# Patient Record
Sex: Female | Born: 1950
Health system: Southern US, Community
[De-identification: ages and names within clinical notes are randomized; demographics above are authoritative.]

## PROBLEM LIST (undated history)

## (undated) DIAGNOSIS — E785 Hyperlipidemia, unspecified: Secondary | ICD-10-CM

## (undated) DIAGNOSIS — R42 Dizziness and giddiness: Secondary | ICD-10-CM

## (undated) DIAGNOSIS — F32A Depression, unspecified: Secondary | ICD-10-CM

## (undated) DIAGNOSIS — Z8701 Personal history of pneumonia (recurrent): Secondary | ICD-10-CM

## (undated) DIAGNOSIS — Z8619 Personal history of other infectious and parasitic diseases: Secondary | ICD-10-CM

## (undated) DIAGNOSIS — G629 Polyneuropathy, unspecified: Secondary | ICD-10-CM

## (undated) DIAGNOSIS — K219 Gastro-esophageal reflux disease without esophagitis: Secondary | ICD-10-CM

## (undated) DIAGNOSIS — E119 Type 2 diabetes mellitus without complications: Secondary | ICD-10-CM

## (undated) DIAGNOSIS — G43909 Migraine, unspecified, not intractable, without status migrainosus: Secondary | ICD-10-CM

## (undated) HISTORY — PX: BREAST BIOPSY: SHX20

## (undated) HISTORY — PX: BILATERAL OOPHORECTOMY: SHX1221

## (undated) HISTORY — DX: Personal history of other infectious and parasitic diseases: Z86.19

## (undated) HISTORY — PX: OTHER SURGICAL HISTORY: SHX169

## (undated) HISTORY — DX: Dizziness and giddiness: R42

## (undated) HISTORY — PX: IUD REMOVAL: SHX5392

## (undated) HISTORY — DX: Type 2 diabetes mellitus without complications: E11.9

## (undated) HISTORY — DX: Personal history of pneumonia (recurrent): Z87.01

## (undated) HISTORY — PX: SHOULDER SURGERY: SHX246

## (undated) HISTORY — DX: Migraine, unspecified, not intractable, without status migrainosus: G43.909

## (undated) HISTORY — DX: Hyperlipidemia, unspecified: E78.5

## (undated) HISTORY — DX: Depression, unspecified: F32.A

## (undated) HISTORY — DX: Gastro-esophageal reflux disease without esophagitis: K21.9

## (undated) HISTORY — PX: ABDOMINAL HYSTERECTOMY: SHX81

## (undated) HISTORY — PX: CHOLECYSTECTOMY: SHX55

---

## 1997-10-30 ENCOUNTER — Other Ambulatory Visit: Admission: RE | Admit: 1997-10-30 | Discharge: 1997-10-30 | Payer: Self-pay | Admitting: *Deleted

## 1998-01-02 ENCOUNTER — Other Ambulatory Visit: Admission: RE | Admit: 1998-01-02 | Discharge: 1998-01-02 | Payer: Self-pay | Admitting: *Deleted

## 1998-05-02 ENCOUNTER — Ambulatory Visit (HOSPITAL_COMMUNITY): Admission: RE | Admit: 1998-05-02 | Discharge: 1998-05-02 | Payer: Self-pay | Admitting: Family Medicine

## 1998-05-02 ENCOUNTER — Encounter: Payer: Self-pay | Admitting: Family Medicine

## 1998-12-18 ENCOUNTER — Other Ambulatory Visit: Admission: RE | Admit: 1998-12-18 | Discharge: 1998-12-18 | Payer: Self-pay | Admitting: *Deleted

## 1999-05-02 ENCOUNTER — Ambulatory Visit (HOSPITAL_COMMUNITY): Admission: RE | Admit: 1999-05-02 | Discharge: 1999-05-02 | Payer: Self-pay | Admitting: Family Medicine

## 1999-05-02 ENCOUNTER — Encounter: Payer: Self-pay | Admitting: Family Medicine

## 2000-02-09 ENCOUNTER — Other Ambulatory Visit: Admission: RE | Admit: 2000-02-09 | Discharge: 2000-02-09 | Payer: Self-pay | Admitting: *Deleted

## 2001-07-01 ENCOUNTER — Encounter: Payer: Self-pay | Admitting: *Deleted

## 2001-07-01 ENCOUNTER — Emergency Department (HOSPITAL_COMMUNITY): Admission: EM | Admit: 2001-07-01 | Discharge: 2001-07-01 | Payer: Self-pay | Admitting: Emergency Medicine

## 2001-07-26 ENCOUNTER — Other Ambulatory Visit: Admission: RE | Admit: 2001-07-26 | Discharge: 2001-07-26 | Payer: Self-pay | Admitting: *Deleted

## 2002-08-08 ENCOUNTER — Other Ambulatory Visit: Admission: RE | Admit: 2002-08-08 | Discharge: 2002-08-08 | Payer: Self-pay | Admitting: *Deleted

## 2003-08-21 ENCOUNTER — Other Ambulatory Visit: Admission: RE | Admit: 2003-08-21 | Discharge: 2003-08-21 | Payer: Self-pay | Admitting: *Deleted

## 2003-12-20 ENCOUNTER — Encounter: Admission: RE | Admit: 2003-12-20 | Discharge: 2003-12-20 | Payer: Self-pay | Admitting: Family Medicine

## 2003-12-25 ENCOUNTER — Encounter: Admission: RE | Admit: 2003-12-25 | Discharge: 2003-12-25 | Payer: Self-pay | Admitting: Family Medicine

## 2004-09-17 ENCOUNTER — Other Ambulatory Visit: Admission: RE | Admit: 2004-09-17 | Discharge: 2004-09-17 | Payer: Self-pay | Admitting: *Deleted

## 2004-10-28 ENCOUNTER — Other Ambulatory Visit: Admission: RE | Admit: 2004-10-28 | Discharge: 2004-10-28 | Payer: Self-pay | Admitting: *Deleted

## 2005-07-15 ENCOUNTER — Other Ambulatory Visit: Admission: RE | Admit: 2005-07-15 | Discharge: 2005-07-15 | Payer: Self-pay | Admitting: *Deleted

## 2005-12-09 ENCOUNTER — Other Ambulatory Visit: Admission: RE | Admit: 2005-12-09 | Discharge: 2005-12-09 | Payer: Self-pay | Admitting: *Deleted

## 2007-01-19 ENCOUNTER — Ambulatory Visit (HOSPITAL_BASED_OUTPATIENT_CLINIC_OR_DEPARTMENT_OTHER): Admission: RE | Admit: 2007-01-19 | Discharge: 2007-01-19 | Payer: Self-pay | Admitting: Orthopedic Surgery

## 2007-05-23 ENCOUNTER — Other Ambulatory Visit: Admission: RE | Admit: 2007-05-23 | Discharge: 2007-05-23 | Payer: Self-pay | Admitting: *Deleted

## 2007-11-29 ENCOUNTER — Emergency Department (HOSPITAL_COMMUNITY): Admission: EM | Admit: 2007-11-29 | Discharge: 2007-11-29 | Payer: Self-pay | Admitting: Emergency Medicine

## 2009-07-29 ENCOUNTER — Emergency Department (HOSPITAL_COMMUNITY): Admission: EM | Admit: 2009-07-29 | Discharge: 2009-07-29 | Payer: Self-pay | Admitting: Family Medicine

## 2010-03-02 ENCOUNTER — Emergency Department (HOSPITAL_COMMUNITY)
Admission: EM | Admit: 2010-03-02 | Discharge: 2010-03-02 | Payer: Self-pay | Source: Home / Self Care | Admitting: Family Medicine

## 2010-03-10 LAB — POCT I-STAT, CHEM 8
BUN: 18 mg/dL (ref 6–23)
Calcium, Ion: 1.07 mmol/L — ABNORMAL LOW (ref 1.12–1.32)
Chloride: 110 mEq/L (ref 96–112)
Creatinine, Ser: 0.9 mg/dL (ref 0.4–1.2)
Glucose, Bld: 124 mg/dL — ABNORMAL HIGH (ref 70–99)
HCT: 45 % (ref 36.0–46.0)
Hemoglobin: 15.3 g/dL — ABNORMAL HIGH (ref 12.0–15.0)
Potassium: 3.7 mEq/L (ref 3.5–5.1)
Sodium: 140 mEq/L (ref 135–145)
TCO2: 24 mmol/L (ref 0–100)

## 2010-03-15 ENCOUNTER — Encounter: Payer: Self-pay | Admitting: Family Medicine

## 2010-07-08 NOTE — Op Note (Signed)
NAMEREEM, Marilyn Alvarez                 ACCOUNT NO.:  [id number]   MEDICAL RECORD NO.:  [id number]          PATIENT TYPE:  AMB   LOCATION:  DSC                          FACILITY:  [facility]   PHYSICIAN:  Mila Homer. Sherlean Foot, M.D. DATE OF BIRTH:  1950-10-31   DATE OF PROCEDURE:  DATE OF DISCHARGE:                               OPERATIVE REPORT   SURGEON:  Mila Homer. Sherlean Foot, M.D.   ASSISTANT:  None.   ANESTHESIA:  General.   PREOPERATIVE DIAGNOSIS:  Right shoulder impingement syndrome and labral  tearing.   POSTOPERATIVE DIAGNOSIS:  Right shoulder impingement syndrome and labral  tearing.   PROCEDURE:  Left shoulder arthroscopy with subacromial decompression and  glenohumeral labral debridement.   INDICATIONS FOR PROCEDURE:  The patient is 60 year old with pain and  failure of conservative measures.  Mechanical symptoms and MRI showing a  rotator cuff tendinosis, labral tearing and bursitis.   DESCRIPTION OF PROCEDURE:  The patient was laid supine and administered  general anesthesia and placed in the beach-chair position.  Left  shoulder was prepped and draped in the usual sterile fashion.  Anterior,  posterior and direct lateral portals were created with #11 blade, blunt  trocar and cannula.  Diagnostic arthroscopy revealed no chondromalacia  in the joint but anterior labral tearing, several radial tears, anterior  labral tearing, several radial tears as well as synovitis.  I debrided  through the anterior portal with a 3.2 Kuda shaver the labral tear.  I  then debrided the synovitis.  I then went up and evaluated the rotator  cuff and there was an undersurface tear at the rotator cuff interval  with a flap.  The flap was debrided.  I then went into the subacromial  space from the posterior portal with the camera.  From the direct  lateral portal, I performed bursectomy with a 3.2 Kuda shaver.  I then  used the ArthroCare debridement wand to release CA ligament and debride  the  undersurface of the acromion. It was definitely at least a type 2.  There was approximately 3 mm of clearance between the bone and the  rotator cuff.  There was no full-thickness tearing.  I used 4 mm  cylindrical bur to perform an aggressive anterolateral acromioplasty.  This afforded excellent decompression.  I then closed with 4-0 nylon,  dressed with Xeroform dressing sponges, ABDs, 2-inch silk tape and  simple sling.   COMPLICATIONS:  None.   DRAINS:  None.           ______________________________  Mila Homer. Sherlean Foot, M.D.    SDL/MEDQ  D:  01/19/2007  T:  01/19/2007  Job:  [id number]

## 2010-11-19 ENCOUNTER — Inpatient Hospital Stay (INDEPENDENT_AMBULATORY_CARE_PROVIDER_SITE_OTHER)
Admission: RE | Admit: 2010-11-19 | Discharge: 2010-11-19 | Disposition: A | Discharge status: Home / Self Care | Payer: 59 | Source: Ambulatory Visit | Attending: Family Medicine | Admitting: Family Medicine

## 2010-11-19 DIAGNOSIS — J069 Acute upper respiratory infection, unspecified: Secondary | ICD-10-CM

## 2010-11-19 LAB — POCT RAPID STREP A: Streptococcus, Group A Screen (Direct): NEGATIVE

## 2010-11-24 LAB — POCT I-STAT, CHEM 8
BUN: 10
Calcium, Ion: 1.09 — ABNORMAL LOW
Chloride: 108
Creatinine, Ser: 0.8
Glucose, Bld: 119 — ABNORMAL HIGH
HCT: 42
Hemoglobin: 14.3
Potassium: 3.9
Sodium: 138
TCO2: 24

## 2010-11-24 LAB — POCT CARDIAC MARKERS
CKMB, poc: <1 — ABNORMAL LOW
Myoglobin, poc: 56
Troponin i, poc: <0.05

## 2010-11-24 LAB — GLUCOSE, CAPILLARY: Glucose-Capillary: 104 — ABNORMAL HIGH

## 2010-12-02 LAB — POCT HEMOGLOBIN-HEMACUE
Hemoglobin: 13.3
Operator id: 208731

## 2011-03-26 ENCOUNTER — Ambulatory Visit
Admission: RE | Admit: 2011-03-26 | Discharge: 2011-03-26 | Disposition: A | Discharge status: Home / Self Care | Payer: 59 | Source: Ambulatory Visit | Attending: Gastroenterology | Admitting: Gastroenterology

## 2011-03-26 ENCOUNTER — Other Ambulatory Visit: Payer: Self-pay | Admitting: Gastroenterology

## 2011-03-26 DIAGNOSIS — R1032 Left lower quadrant pain: Secondary | ICD-10-CM

## 2011-03-26 MED ORDER — IOHEXOL 300 MG/ML  SOLN
100.0000 mL | Freq: Once | INTRAMUSCULAR | Status: AC | PRN
  Administered 2011-03-26: 100 mL via INTRAVENOUS

## 2013-02-06 ENCOUNTER — Encounter (HOSPITAL_COMMUNITY): Payer: Self-pay | Admitting: Emergency Medicine

## 2013-02-06 ENCOUNTER — Emergency Department (HOSPITAL_COMMUNITY)
Admission: EM | Admit: 2013-02-06 | Discharge: 2013-02-06 | Disposition: A | Discharge status: Home / Self Care | Payer: 59 | Source: Home / Self Care | Attending: Emergency Medicine | Admitting: Emergency Medicine

## 2013-02-06 DIAGNOSIS — N390 Urinary tract infection, site not specified: Secondary | ICD-10-CM

## 2013-02-06 DIAGNOSIS — R81 Glycosuria: Secondary | ICD-10-CM

## 2013-02-06 HISTORY — DX: Polyneuropathy, unspecified: G62.9

## 2013-02-06 LAB — POCT URINALYSIS DIP (DEVICE)
Glucose, UA: 250 mg/dL — AB
Nitrite: POSITIVE — AB
Protein, ur: 100 mg/dL — AB
Specific Gravity, Urine: 1.02 (ref 1.005–1.030)
Urobilinogen, UA: 4 mg/dL — ABNORMAL HIGH (ref 0.0–1.0)
pH: 5 (ref 5.0–8.0)

## 2013-02-06 MED ORDER — CEPHALEXIN 500 MG PO CAPS: 500.0000 mg | ORAL_CAPSULE | Freq: Four times a day (QID) | ORAL | Status: DC

## 2013-02-06 NOTE — ED Provider Notes (Signed)
CSN: [id number]     Arrival date & time 02/06/13  1631 History   First MD Initiated Contact with Patient 02/06/13 1844     Chief Complaint  Patient presents with  . Urinary Tract Infection   (Consider location/radiation/quality/duration/timing/severity/associated sxs/prior Treatment) HPI Comments: Pt with young adult hx of frequent utis. Took low dose antibiotic daily for 2 years, recurrent utis stopped until 2 months ago. UTI in 11/2012, tx with septra.  Similar sx yesterday and today as in October. Also has hx diabetes but was able to eliminate dx with diet and exercise.   Patient is a 62 y.o. female presenting with urinary tract infection. The history is provided by the patient.  Urinary Tract Infection This is a new problem. The current episode started yesterday. The problem occurs constantly. The problem has been gradually worsening. Associated symptoms include abdominal pain. Nothing aggravates the symptoms. Nothing relieves the symptoms. Treatments tried: otc AZO.  The treatment provided no relief.    Past Medical History  Diagnosis Date  . Peripheral neuropathy    Past Surgical History  Procedure Laterality Date  . Abdominal hysterectomy    . Cholecystectomy    . Oopherectomy    . Shoulder surgery     History reviewed. No pertinent family history. History  Substance Use Topics  . Smoking status: Never Smoker   . Smokeless tobacco: Not on file  . Alcohol Use: No   OB History   Grav Para Term Preterm Abortions TAB SAB Ect Mult Living                 Review of Systems  Constitutional: Positive for fever and chills.  Gastrointestinal: Positive for nausea and abdominal pain. Negative for vomiting.  Genitourinary: Positive for dysuria, frequency and hematuria. Negative for flank pain and vaginal discharge.    Allergies  Thimerosal  Home Medications   Current Outpatient Rx  Name  Route  Sig  Dispense  Refill  . cephALEXin (KEFLEX) 500 MG capsule   Oral   Take 1  capsule (500 mg total) by mouth 4 (four) times daily.   28 capsule   0    BP 149/88  Pulse 94  Temp(Src) 99 F (37.2 C) (Oral)  Resp 20  SpO2 97% Physical Exam  Constitutional: She appears well-developed and well-nourished. No distress.  Cardiovascular: Normal rate and regular rhythm.   Pulmonary/Chest: Effort normal and breath sounds normal.  Abdominal: Soft. Bowel sounds are normal. She exhibits no distension. There is tenderness in the right lower quadrant and suprapubic area. There is no rigidity, no rebound, no guarding and no CVA tenderness.  Tenderness to palp is mild    ED Course  Procedures (including critical care time) Labs Review Labs Reviewed  POCT URINALYSIS DIP (DEVICE) - Abnormal; Notable for the following:    Glucose, UA 250 (*)    Bilirubin Urine SMALL (*)    Ketones, ur TRACE (*)    Hgb urine dipstick LARGE (*)    Protein, ur 100 (*)    Urobilinogen, UA 4.0 (*)    Nitrite POSITIVE (*)    Leukocytes, UA LARGE (*)    All other components within normal limits  URINE CULTURE   Imaging Review No results found.  EKG Interpretation    Date/Time:    Ventricular Rate:    PR Interval:    QRS Duration:   QT Interval:    QTC Calculation:   R Axis:     Text Interpretation:  MDM   1. UTI (lower urinary tract infection)   2. Glucosuria   rx keflex 500mg  QID #28. Pt to f/u with pcp within the week about sugar in urine.      Cathlyn Parsons, NP 02/06/13 [phone]

## 2013-02-06 NOTE — ED Notes (Signed)
C/o urinary urgency and frequency. Dysuria. Hematuria. Fever.  On set last  Night.  Mild relief with taking azo

## 2013-02-06 NOTE — ED Provider Notes (Signed)
Medical screening examination/treatment/procedure(s) were performed by non-physician practitioner and as supervising physician I was immediately available for consultation/collaboration.  Leslee Home, M.D.   Reuben Likes, MD 02/06/13 [phone]

## 2013-02-08 LAB — URINE CULTURE
Colony Count: >=100000
Special Requests: NORMAL

## 2013-02-09 NOTE — ED Notes (Signed)
Urine culture: >100,000 colonies E. Coli. Pt. adequately treated with Keflex. Marilyn Alvarez 02/09/2013

## 2015-06-13 ENCOUNTER — Encounter: Payer: Self-pay | Admitting: Neurology

## 2015-06-13 ENCOUNTER — Ambulatory Visit (INDEPENDENT_AMBULATORY_CARE_PROVIDER_SITE_OTHER): Payer: Commercial Managed Care - HMO | Admitting: Neurology

## 2015-06-13 VITALS — BP 129/83 | HR 78 | Ht 64.5 in | Wt 163.0 lb

## 2015-06-13 DIAGNOSIS — H8112 Benign paroxysmal vertigo, left ear: Secondary | ICD-10-CM

## 2015-06-13 DIAGNOSIS — G6289 Other specified polyneuropathies: Secondary | ICD-10-CM

## 2015-06-13 DIAGNOSIS — R519 Headache, unspecified: Secondary | ICD-10-CM

## 2015-06-13 DIAGNOSIS — H8110 Benign paroxysmal vertigo, unspecified ear: Secondary | ICD-10-CM | POA: Insufficient documentation

## 2015-06-13 DIAGNOSIS — R51 Headache: Secondary | ICD-10-CM | POA: Diagnosis not present

## 2015-06-13 DIAGNOSIS — G629 Polyneuropathy, unspecified: Secondary | ICD-10-CM | POA: Insufficient documentation

## 2015-06-13 MED ORDER — KETOROLAC TROMETHAMINE 60 MG/2ML IM SOLN
60.0000 mg | Freq: Once | INTRAMUSCULAR | Status: AC
  Administered 2015-06-13: 60 mg via INTRAMUSCULAR

## 2015-06-13 MED ORDER — GABAPENTIN 100 MG PO CAPS: 300.0000 mg | ORAL_CAPSULE | Freq: Three times a day (TID) | ORAL | Status: DC

## 2015-06-13 MED ORDER — KETOROLAC TROMETHAMINE 60 MG/2ML IM SOLN: 60.0000 mg | Freq: Once | INTRAMUSCULAR | Status: DC

## 2015-06-13 NOTE — Patient Instructions (Signed)
Benign Positional Vertigo Vertigo is the feeling that you or your surroundings are moving when they are not. Benign positional vertigo is the most common form of vertigo. The cause of this condition is not serious (is benign). This condition is triggered by certain movements and positions (is positional). This condition can be dangerous if it occurs while you are doing something that could endanger you or others, such as driving.  CAUSES In many cases, the cause of this condition is not known. It may be caused by a disturbance in an area of the inner ear that helps your brain to sense movement and balance. This disturbance can be caused by a viral infection (labyrinthitis), head injury, or repetitive motion. RISK FACTORS This condition is more likely to develop in:  Women.  People who are 50 years of age or older. SYMPTOMS Symptoms of this condition usually happen when you move your head or your eyes in different directions. Symptoms may start suddenly, and they usually last for less than a minute. Symptoms may include:  Loss of balance and falling.  Feeling like you are spinning or moving.  Feeling like your surroundings are spinning or moving.  Nausea and vomiting.  Blurred vision.  Dizziness.  Involuntary eye movement (nystagmus). Symptoms can be mild and cause only slight annoyance, or they can be severe and interfere with daily life. Episodes of benign positional vertigo may return (recur) over time, and they may be triggered by certain movements. Symptoms may improve over time. DIAGNOSIS This condition is usually diagnosed by medical history and a physical exam of the head, neck, and ears. You may be referred to a health care provider who specializes in ear, nose, and throat (ENT) problems (otolaryngologist) or a provider who specializes in disorders of the nervous system (neurologist). You may have additional testing, including:  MRI.  A CT scan.  Eye movement tests. Your  health care provider may ask you to change positions quickly while he or she watches you for symptoms of benign positional vertigo, such as nystagmus. Eye movement may be tested with an electronystagmogram (ENG), caloric stimulation, the Dix-Hallpike test, or the roll test.  An electroencephalogram (EEG). This records electrical activity in your brain.  Hearing tests. TREATMENT Usually, your health care provider will treat this by moving your head in specific positions to adjust your inner ear back to normal. Surgery may be needed in severe cases, but this is rare. In some cases, benign positional vertigo may resolve on its own in 2-4 weeks. HOME CARE INSTRUCTIONS Safety  Move slowly.Avoid sudden body or head movements.  Avoid driving.  Avoid operating heavy machinery.  Avoid doing any tasks that would be dangerous to you or others if a vertigo episode would occur.  If you have trouble walking or keeping your balance, try using a cane for stability. If you feel dizzy or unstable, sit down right away.  Return to your normal activities as told by your health care provider. Ask your health care provider what activities are safe for you. General Instructions  Take over-the-counter and prescription medicines only as told by your health care provider.  Avoid certain positions or movements as told by your health care provider.  Drink enough fluid to keep your urine clear or pale yellow.  Keep all follow-up visits as told by your health care provider. This is important. SEEK MEDICAL CARE IF:  You have a fever.  Your condition gets worse or you develop new symptoms.  Your family or friends   notice any behavioral changes.  Your nausea or vomiting gets worse.  You have numbness or a "pins and needles" sensation. SEEK IMMEDIATE MEDICAL CARE IF:  You have difficulty speaking or moving.  You are always dizzy.  You faint.  You develop severe headaches.  You have weakness in your  legs or arms.  You have changes in your hearing or vision.  You develop a stiff neck.  You develop sensitivity to light.   This information is not intended to replace advice given to you by your health care provider. Make sure you discuss any questions you have with your health care provider.   Document Released: 11/17/2005 Document Revised: 10/31/2014 Document Reviewed: 06/04/2014 Elsevier Interactive Patient Education 2016 [employer]  

## 2015-06-13 NOTE — Progress Notes (Signed)
PATIENT: Marilyn Alvarez DOB: 07/25/1950  Chief Complaint  Patient presents with  . Dizziness    Orthostatic Vitals: Lying: 128/83,78, Sitting: 133/93, 82, Standing: 126/88,88.  She is here to have her persistent vertigo evaluated.  States her symptoms worsen with positional changes.  She has also experienced frequent headaches and nausea associated with these episodes.     HISTORICAL  Marilyn Alvarez is a 65 years old right-handed female, alone at today's clinical visit, seen in refer by her primary care physician Dr.  Glenda Chroman in June 13 2015 for evaluation of dizziness.  Vertigo: She had acute onset Vertigo in April 14 2015, after she woke up from overnight sleep, turning to the left side, she experienced acute onset spinning sensation, nausea, unsteady gait, improved by visual suppression, intermittent for one week,  Ever since the initial event, with sudden positional change, especially when lying down at nighttime turning towards the left side, she would experience transient severe vertigo, she denies hearing change, no tinnitus   I have personally reviewed MRI of the brain without contrast in March 2017 from [facility], there was no significant abnormality noticed.  Migraine headaches: Chronic, lateralized pounding headache, with light noise sensitivity, nauseous, she has been taking Tylenol as needed, which has been helpful, tried over-the-counter ibuprofen without benefit, she has migraine headaches about 3-4 times each month. With her new-onset Vertigo, she often has moderate to severe headache afterwards.   Peripheral neuropathy: She had a history of gradual onset bilateral feet paresthesia since 2002, her mother also has diabetic peripheral neuropathy, daughter has type 1 diabetes suffered diabetic peripheral neuropathy, she presented with bilateral plantar feet coldness sensation, numbness tingling, slow progressive over the past decade, now also involving top of her  feet, and occasionally bilateral fingertips paresthesia, recently she also experienced hot flashes, starting from bilateral feet numbness tingling very uncomfortable sensation, spreading forward with a hot flash, couple episodes a day,   REVIEW OF SYSTEMS: Full 14 system review of systems performed and notable only for Weight gain, fatigue, murmur, spinning sensation, flushing, joint pain, headaches, numbness, dizziness, decreased energy  ALLERGIES: Allergies  Allergen Reactions  . Latex Other (See Comments)    Blisters  . Thimerosal     HOME MEDICATIONS: Current Outpatient Prescriptions  Medication Sig Dispense Refill  . diazepam (VALIUM) 5 MG tablet Take 5 mg by mouth every 12 (twelve) hours as needed.     . Meclizine HCl 25 MG CHEW Chew 25 mg by mouth 3 (three) times daily as needed.    . promethazine (PHENERGAN) 25 MG tablet Take 25 mg by mouth 2 (two) times daily as needed.      No current facility-administered medications for this visit.    PAST MEDICAL HISTORY: Past Medical History  Diagnosis Date  . Peripheral neuropathy ([place])   . Migraine   . Vertigo     PAST SURGICAL HISTORY: Past Surgical History  Procedure Laterality Date  . Abdominal hysterectomy    . Cholecystectomy    . Oopherectomy    . Shoulder surgery    . Iud removal      FAMILY HISTORY: Family History  Problem Relation Age of Onset  . Neuropathy Mother   . Diabetes Mother   . Diabetes Brother   . Lung cancer Father   . Diabetes Daughter     SOCIAL HISTORY:  Social History   Social History  . Marital Status: Married    Spouse Name: N/A  . Number of  Children: 3  . Years of Education: Masters   Occupational History  . Retired    Social History Main Topics  . Smoking status: Never Smoker   . Smokeless tobacco: Not on file  . Alcohol Use: No  . Drug Use: No  . Sexual Activity: Yes   Other Topics Concern  . Not on file   Social History Narrative   Lives at home with husband and  her mother.   Right-handed.   2-3 cups caffeine daily.     PHYSICAL EXAM   Filed Vitals:   06/13/15 1056  BP: 129/83  Pulse: 78  Height: 5' 4.5" (1.638 m)  Weight: 163 lb (73.936 kg)    Not recorded      Body mass index is 27.56 kg/(m^2).  PHYSICAL EXAMNIATION:  Gen: NAD, conversant, well nourised, obese, well groomed                     Cardiovascular: Regular rate rhythm, no peripheral edema, warm, nontender. Eyes: Conjunctivae clear without exudates or hemorrhage Neck: Supple, no carotid bruise. Pulmonary: Clear to auscultation bilaterally   NEUROLOGICAL EXAM:  MENTAL STATUS: Speech:    Speech is normal; fluent and spontaneous with normal comprehension.  Cognition:     Orientation to time, place and person     Normal recent and remote memory     Normal Attention span and concentration     Normal Language, naming, repeating,spontaneous speech     Fund of knowledge   CRANIAL NERVES: CN II: Visual fields are full to confrontation. Fundoscopic exam is normal with sharp discs and no vascular changes. Pupils are round equal and briskly reactive to light. CN III, IV, VI: extraocular movement are normal. No ptosis. CN V: Facial sensation is intact to pinprick in all 3 divisions bilaterally. Corneal responses are intact.  CN VII: Face is symmetric with normal eye closure and smile. CN VIII: Hearing is normal to rubbing fingers, bilateral tympanic membrane were intact. CN IX, X: Palate elevates symmetrically. Phonation is normal. CN XI: Head turning and shoulder shrug are intact CN XII: Tongue is midline with normal movements and no atrophy.  MOTOR: There is no pronator drift of out-stretched arms. Muscle bulk and tone are normal. Muscle strength is normal.  REFLEXES: Reflexes are 2+ and symmetric at the biceps, triceps, knees, and decreased at ankles. Plantar responses are flexor.  SENSORY: Length dependent decreased to light touch, pinprick and vibratory sensation  to ankle level.  COORDINATION: Rapid alternating movements and fine finger movements are intact. There is no dysmetria on finger-to-nose and heel-knee-shin.    GAIT/STANCE: Wide-based, cautious  Apley's maneuver:  I performed Apley's maneuver twice, with left ear dependent position first, after short latency, she developed severe rotatory downward beating nystagmus, with reported severe vertigo, nausea, quickly habituate,  there was also mild horizontal nystagmus on gaze to the left after maneuver.  DIAGNOSTIC DATA (LABS, IMAGING, TESTING) - I reviewed patient records, labs, notes, testing and imaging myself where available.   ASSESSMENT AND PLAN  Marilyn Alvarez is a 65 y.o. female    Benign positional vertigo  Most likely involving left posterior semicircular canal  Improved with repositioning maneuver Migraine headaches  Give her Toradol 60 mg injection today  Continue Tylenol as needed  Peripheral neuropathy  Most suggestive of small fiber peripheral neuropathy  Add on gabapentin titrating to 300 mg 3 times a day   Marcial Pacas, M.D. Ph.D.  [facility] [address]  [place], [place] [address] Ph: [phone] Fax: [phone]  CC: Glenda Chroman, MD

## 2015-06-20 ENCOUNTER — Other Ambulatory Visit: Payer: Self-pay | Admitting: *Deleted

## 2015-06-20 ENCOUNTER — Encounter: Payer: Self-pay | Admitting: Neurology

## 2015-06-20 DIAGNOSIS — H8110 Benign paroxysmal vertigo, unspecified ear: Secondary | ICD-10-CM

## 2015-06-20 DIAGNOSIS — H8112 Benign paroxysmal vertigo, left ear: Secondary | ICD-10-CM

## 2015-06-20 NOTE — Telephone Encounter (Signed)
Please let patient know, I have referred her to vestibular dilatation

## 2015-07-02 ENCOUNTER — Ambulatory Visit: Payer: Commercial Managed Care - HMO | Attending: Neurology | Admitting: Physical Therapy

## 2015-07-02 ENCOUNTER — Encounter: Payer: Self-pay | Admitting: Physical Therapy

## 2015-07-02 DIAGNOSIS — R42 Dizziness and giddiness: Secondary | ICD-10-CM

## 2015-07-02 DIAGNOSIS — R2689 Other abnormalities of gait and mobility: Secondary | ICD-10-CM | POA: Diagnosis present

## 2015-07-02 DIAGNOSIS — R2681 Unsteadiness on feet: Secondary | ICD-10-CM | POA: Diagnosis present

## 2015-07-02 DIAGNOSIS — H8112 Benign paroxysmal vertigo, left ear: Secondary | ICD-10-CM | POA: Insufficient documentation

## 2015-07-02 NOTE — Therapy (Signed)
[facility] [address] [place], [place], [address] Phone: [phone]   Fax:  [phone]  Physical Therapy Evaluation  Patient Details  Name: Marilyn Alvarez MRN: [id number] Date of Birth: 02-Sep-1950 Referring Provider: Marcial Pacas, MD  Encounter Date: 07/02/2015      PT End of Session - 07/02/15 0918    Visit Number 1   Number of Visits 9  eval + 8 visits   Date for PT Re-Evaluation 08/01/15   Authorization Type [employer]   PT Start Time 0802   PT Stop Time 0903   PT Time Calculation (min) 61 min   Activity Tolerance Other (comment)  Pt limited by nausea.   Behavior During Therapy [facility] for tasks assessed/performed      Past Medical History  Diagnosis Date  . Peripheral neuropathy ([place])   . Migraine   . Vertigo     Past Surgical History  Procedure Laterality Date  . Abdominal hysterectomy    . Cholecystectomy    . Oopherectomy    . Shoulder surgery    . Iud removal      There were no vitals filed for this visit.       Subjective Assessment - 07/02/15 0809    Subjective "It's been 2 months ago now that this started. All I did was turn over on my left side, and the room started spinning. So I took my meclizine. After about a week of taking meclizine, I turned over on my left side, and it kicked in again."   Pertinent History PMH significant for: peripehral neuropathy, migraine, vertigo   Patient Stated Goals "I just want to live a normal life. I like to be able to look for fossils, but I can't do. that."   Currently in Pain? No/denies  Sometimes pt experiences headache after bad episode of vertigo            [facility] PT Assessment - 07/02/15 0001    Assessment   Medical Diagnosis BPPV, left   Referring Provider Marcial Pacas, MD   Onset Date/Surgical Date 04/24/15   Precautions   Precautions Fall   Restrictions   Weight Bearing Restrictions No   Balance Screen   Has the patient fallen in  the past 6 months Yes   How many times? 1   Has the patient had a decrease in activity level because of a fear of falling?  Yes   Is the patient reluctant to leave their home because of a fear of falling?  Yes   [place] residence   Living Arrangements Spouse/significant other  and mother   Type of [place] to enter   Entrance Stairs-Number of Steps [place] - 4 wheels   Additional Comments Pt lives primarily on main level.   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, plays piano, gardening and planting flowers   Cognition   Overall Cognitive Status Within Functional Limits for tasks assessed            Vestibular Assessment - 07/02/15 0001    Symptom Behavior   Type of Dizziness Spinning   Frequency of Dizziness daily   Duration of Dizziness < 1 minute   Aggravating Factors Looking up to the ceiling;Sitting with head tilted  back;Turning body quickly;Turning head quickly;Turning head sideways;Supine to sit;Rolling to right;Rolling to left;Forward bending   Relieving Factors Head stationary   Occulomotor Exam   Occulomotor Alignment Normal   Spontaneous Absent   Gaze-induced Absent   Smooth Pursuits Intact  onset of nausea during visual tracking   Comment Convergence appears WNL. Head Thrust Test not formally assessed due to onset of nausea during viusal tracking.   Positional Testing   Dix-Hallpike Dix-Hallpike Left   Sidelying Test Sidelying Right;Sidelying Left   Horizontal Canal Testing Horizontal Canal Right;Horizontal Canal Left;Horizontal Canal Right Intensity;Horizontal Canal Left Intensity   Dix-Hallpike Left   Dix-Hallpike Left Duration No nystagmus, no true vertigo but increased nausea   Dix-Hallpike Left Symptoms No nystagmus   Sidelying Right    Sidelying Right Duration NA   Sidelying Right Symptoms No nystagmus   Sidelying Left   Sidelying Left Duration No nystamus, no true vertigo; increased nausea   Sidelying Left Symptoms No nystagmus   Horizontal Canal Right   Horizontal Canal Right Duration x15 seconds accompanied by vertigo.  visible with Frenzel lenses   Horizontal Canal Right Symptoms Ageotrophic;Nystagmus   Horizontal Canal Left   Horizontal Canal Left Duration <10 seconds accompanied by true vertigo; severity of vertigo on L less than R.  visible with Frenzel lenses   Horizontal Canal Left Symptoms Ageotrophic;Nystagmus   Horizontal Canal Right Intensity   Horizontal Canal Right Intensity Severe   Right Intensity Comment Prior to L Gufoni x2.   Horizontal Canal Left Intensity   Horizontal Canal Left Intensity Moderate   Left Intensity Comment Prior to L Gufoni x2.               [place] Adult PT Treatment/Exercise - 07/02/15 0001    Transfers   Transfers Sit to Stand;Stand to Sit   Sit to Stand 5: Supervision;4: Min assist  (S) prior to repositioning maneuvers; min A after maneuvers   Stand to Sit 4: Min assist;5: Supervision  (S) prior to repositioning maneuvers; min A after maneuvers   Ambulation/Gait   Ambulation/Gait Yes   Ambulation/Gait Assistance 5: Supervision;4: Min guard;4: Min assist   Ambulation/Gait Assistance Details Prior to session, pt very guarded during ambulation, demonstrating minimal to no head movement, minimal arm swing, short steps, and wide BOS. After maneuvers, pt required min A, HHA for ambulation. Husband present to provide assist upon pt departure from clinic.    Ambulation Distance (Feet) 200 Feet  2 x100'   Assistive device None   Gait Pattern Step-through pattern;Decreased arm swing - right;Decreased arm swing - left;Decreased stride length;Shuffle;Decreased weight shift to right;Decreased weight shift to left;Decreased trunk rotation;Wide base of support  minimal head  movement; turns en bloc   Ambulation Surface Level;Indoor         Vestibular Treatment/Exercise - 07/02/15 0001    Vestibular Treatment/Exercise   Vestibular Treatment Provided Canalith Repositioning   Canalith Repositioning Comment;Canal Roll Left   Canal Roll Left   Number of Reps  1  after L Gufoni maneuver x2; see below for details   Overall Response  Improved Symptoms   Response Details  Symptoms of spinning improved; symptoms of disequilibrium worsened with standing/gait.   OTHER   Comment Gufoni maneuver performed starting on affected (left) side x1 minute, then turned head 45 degrees to R and held x1 minute. Performed x2 trials. Reassessment of horizontal canals revealed geotrophic nystagmus with severity and duration of nystagmus on L > R. Therefore, treated with L Canal Roll.  PT Education - 07/02/15 0902    Education provided Yes   Education Details PT eval findings, goals, and POC. Explained nature of BPPV (emphasis on cupulolithiasis) and what to expect after this session.    Person(s) Educated Patient   Methods Explanation   Comprehension Verbalized understanding          PT Short Term Goals - 07/02/15 0923    PT SHORT TERM GOAL #1   Title STG's = LTG's   PT SHORT TERM GOAL #2   Title --   PT SHORT TERM GOAL #3   Title --           PT Long Term Goals - 07/02/15 0925    PT LONG TERM GOAL #1   Title Positional vertigo testing will be negative to indicate resolved BPPV.  (Target date: 07/30/15)   PT LONG TERM GOAL #2   Title Pt will decrease DHI score from 62 to < / = 44 to indicate significant decrease in pt-perceived disability due to dizziness   PT LONG TERM GOAL #3   Title Assess strength and balance, if indicated, after vertigo clears.                Plan - 07/02/15 0919    Clinical Impression Statement Pt is a 65 y/o F referred to vestibular PT to address BPPV. PMH significant for: migraine, vertigo, and peripheral  neuropathy. PT evaluation reveals ageotrophic nystagmus (severity and duration on R > L) upon assessment of horizontal canals. Unable to rule out L horizontal cupulolithasis. Performed L Gufoni maneuver x2 trials. Reassessment of horizontal canals revealed geotrophic nystagmus with severity of symptoms and duration of nystagmus on L > R. Therefore, treated with L Canal Roll x1 trial. Post-treatment, vertiginous symptoms improved but symptoms of disequilibrium and imbalance more prominent. Pt will benefit from skilled outpatient PT 2x/week for 4 weeks ot address said impairments.    Rehab Potential Good   PT Frequency 2x / week   PT Duration 4 weeks   PT Treatment/Interventions ADLs/Self Care Home Management;Gait training;Canalith Repostioning;Vestibular;DME Instruction;Neuromuscular re-education;Stair training;Functional mobility training;Therapeutic activities;Patient/family education;Therapeutic exercise;Balance training   PT Next Visit Plan Reassess for BPPV (L HC) and treat prn. If indicated, provide HEP for habituation. Assess VOR when pt better able to tolerate head movement.   Consulted and Agree with Plan of Care Patient      Patient will benefit from skilled therapeutic intervention in order to improve the following deficits and impairments:  Abnormal gait, Dizziness, Decreased balance  Visit Diagnosis: BPPV (benign paroxysmal positional vertigo), left - Plan: PT plan of care cert/re-cert  Dizziness and giddiness - Plan: PT plan of care cert/re-cert  Other abnormalities of gait and mobility - Plan: PT plan of care cert/re-cert  Unsteadiness on feet - Plan: PT plan of care cert/re-cert     Problem List Patient Active Problem List   Diagnosis Date Noted  . Vertigo, benign positional 06/13/2015  . Peripheral neuropathy ([place]) 06/13/2015    Billie Ruddy, PT, DPT [facility] [address] [place], [place], [address] Phone: [phone]    Fax:  [phone] 07/02/2015, 9:27 AM  Name: Warren Chrystal MRN: [id number] Date of Birth: 1950-08-02

## 2015-07-05 ENCOUNTER — Ambulatory Visit: Payer: Commercial Managed Care - HMO | Admitting: Physical Therapy

## 2015-07-11 ENCOUNTER — Ambulatory Visit: Payer: Commercial Managed Care - HMO | Admitting: Rehabilitative and Restorative Service Providers"

## 2015-07-11 DIAGNOSIS — H8112 Benign paroxysmal vertigo, left ear: Secondary | ICD-10-CM | POA: Diagnosis not present

## 2015-07-11 DIAGNOSIS — R2681 Unsteadiness on feet: Secondary | ICD-10-CM

## 2015-07-11 DIAGNOSIS — R2689 Other abnormalities of gait and mobility: Secondary | ICD-10-CM

## 2015-07-11 DIAGNOSIS — R42 Dizziness and giddiness: Secondary | ICD-10-CM

## 2015-07-11 NOTE — Patient Instructions (Signed)
Do rolling and sit<>sidelying exercise until you have 2 days without symptoms (no dizziness, nausea, room movement).  Can keep and try if any vertigo returns in the future.  Tip Card 1.The goal of habituation training is to assist in decreasing symptoms of vertigo, dizziness, or nausea provoked by specific head and body motions. 2.These exercises may initially increase symptoms; however, be persistent and work through symptoms. With repetition and time, the exercises will assist in reducing or eliminating symptoms. 3.Exercises should be stopped and discussed with the therapist if you experience any of the following: - Sudden change or fluctuation in hearing - New onset of ringing in the ears, or increase in current intensity - Any fluid discharge from the ear - Severe pain in neck or back - Extreme nausea  Copyright  VHI. All rights reserved.  Rolling   With pillow under head, start on back. Roll to your right side.  Hold until dizziness stops, plus 20 seconds and then roll to the left side.  Hold until dizziness stops, plus 20 seconds.  Repeat sequence 3-5 times per session. Do 2 sessions per day.  Copyright  VHI. All rights reserved.  Sit to Side-Lying   Sit on edge of bed. Lie down onto the right side and hold until dizziness stops, plus 20 seconds.  Return to sitting and wait until dizziness stops, plus 20 seconds.  Repeat to the left side. Repeat sequence 3-5 times per session. Do 2 sessions per day.  Copyright  VHI. All rights reserved.  Gaze Stabilization: Tip Card 1.Target must remain in focus, not blurry, and appear stationary while head is in motion. 2.Perform exercises with small head movements (45 to either side of midline). 3.Increase speed of head motion so long as target is in focus. 4.If you wear eyeglasses, be sure you can see target through lens (therapist will give specific instructions for bifocal / progressive lenses). 5.These exercises may provoke dizziness or  nausea. Work through these symptoms. If too dizzy, slow head movement slightly. Rest between each exercise. 6.Exercises demand concentration; avoid distractions. 7.For safety, perform standing exercises close to a counter, wall, corner, or next to someone.  Copyright  VHI. All rights reserved.   Do this exercise for 4-6 weeks or until you are able to do quickly for one minute without dizziness, nausea, or visual blurring.  Gaze Stabilization: Standing Feet Apart   Feet shoulder width apart, keeping eyes on target on wall 3 feet away, tilt head down slightly and move head side to side for 30 seconds.  Do 2 sessions per day.   Copyright  VHI. All rights reserved.

## 2015-07-11 NOTE — Therapy (Signed)
[facility] [address] [place], [place], [address] Phone: [phone]   Fax:  [phone]  Physical Therapy Treatment  Patient Details  Name: Marilyn Alvarez MRN: [id number] Date of Birth: 1950/04/08 Referring Provider: Marcial Pacas, MD  Encounter Date: 07/11/2015      PT End of Session - 07/11/15 1406    Visit Number 2   Number of Visits 9  eval + 8 visits   Date for PT Re-Evaluation 08/01/15   Authorization Type [employer]   PT Start Time 1320   PT Stop Time 1400   PT Time Calculation (min) 40 min   Activity Tolerance Other (comment)  Pt limited by nausea.   Behavior During Therapy [facility] for tasks assessed/performed      Past Medical History  Diagnosis Date  . Peripheral neuropathy ([place])   . Migraine   . Vertigo     Past Surgical History  Procedure Laterality Date  . Abdominal hysterectomy    . Cholecystectomy    . Oopherectomy    . Shoulder surgery    . Iud removal      There were no vitals filed for this visit.      Subjective Assessment - 07/11/15 1322    Subjective The patient reports it is improved, but not 100% resolved.  Sensation that that dizziness could begin, but spinning does not occur. Has right ear ache that she thinks is related to a tooth.   Pertinent History PMH significant for: peripehral neuropathy, migraine, vertigo   Patient Stated Goals "I just want to live a normal life. I like to be able to look for fossils, but I can't do. that."   Currently in Pain? No/denies                Vestibular Assessment - 07/11/15 1324    Vestibular Assessment   General Observation *Patient experiences nausea with positional testing and rests x 5 minutes with glasses donned before continuing.    Positional Testing   Dix-Hallpike Dix-Hallpike Right;Dix-Hallpike Left   Sidelying Test Sidelying Right;Sidelying Left   Horizontal Canal Testing Horizontal Canal Right;Horizontal Canal Left    Dix-Hallpike Right   Dix-Hallpike Right Duration none with coming into position, however sensation of environmental movement (not spinning) with return to sit   Dix-Hallpike Right Symptoms No nystagmus   Dix-Hallpike Left   Dix-Hallpike Left Duration none   Dix-Hallpike Left Symptoms No nystagmus   Sidelying Right   Sidelying Right Duration none   Sidelying Right Symptoms No nystagmus   Sidelying Left   Sidelying Left Duration Mild nausea/ sense of room movement/ no spining   Sidelying Left Symptoms No nystagmus   Horizontal Canal Right   Horizontal Canal Right Duration mild sensation of one beat of room moving, nothing viewed in room light   Horizontal Canal Right Symptoms Normal   Horizontal Canal Left   Horizontal Canal Left Duration none   Horizontal Canal Left Symptoms Normal                  Vestibular Treatment/Exercise - 07/11/15 1332    Vestibular Treatment/Exercise   Vestibular Treatment Provided Habituation;Gaze   Habituation Exercises Brandt Daroff;Horizontal Roll   Gaze Exercises X1 Viewing Horizontal;X1 Viewing Vertical   Nestor Lewandowsky   Number of Reps  3   Symptom Description  Initially experiences symptoms to the left side of room movement x seconds, no nystagmus viewed in room light.  Resolved with repetitions.  *Patient wore glasses  due to visual fixation used to help calm nausea after positional testing.    Horizontal Roll   Number of Reps  3   Symptom Description  INitially experienced    X1 Viewing Horizontal   Foot Position seated   Reps 2  30 seconds in horizontal plane   Comments Patient reports visual blurring to the right side with gaze x 1 viewing   X1 Viewing Vertical   Foot Position seated- no symtpoms or difficulty with this exercise- only provided horizontal for HEP               PT Education - 07/11/15 1405    Education provided Yes   Education Details HEP: gaze x 1 viewing, habituation horizontal rolling and  sit<>sidelying.   Person(s) Educated Patient   Methods Explanation;Demonstration;Handout   Comprehension Verbalized understanding;Returned demonstration          PT Short Term Goals - 07/02/15 0923    PT SHORT TERM GOAL #1   Title STG's = LTG's   PT SHORT TERM GOAL #2   Title --   PT SHORT TERM GOAL #3   Title --           PT Long Term Goals - 07/02/15 0925    PT LONG TERM GOAL #1   Title Positional vertigo testing will be negative to indicate resolved BPPV.  (Target date: 07/30/15)   PT LONG TERM GOAL #2   Title Pt will decrease DHI score from 62 to < / = 44 to indicate significant decrease in pt-perceived disability due to dizziness   PT LONG TERM GOAL #3   Title Assess strength and balance, if indicated, after vertigo clears.                Plan - 07/11/15 1406    Clinical Impression Statement The patient has improved significantly with no further c/o room spinning.  She continues with a trace of subjective dizziness and sensation of room movement x <5 seconds with right rolling and with L sidelying.  PT educated patient in habituation for tx for motion sensitivity.  Also provided gaze x 1 adaptation with patient c/o visual blurring to the right.  Instructed for HEP.    PT Treatment/Interventions ADLs/Self Care Home Management;Gait training;Canalith Repostioning;Vestibular;DME Instruction;Neuromuscular re-education;Stair training;Functional mobility training;Therapeutic activities;Patient/family education;Therapeutic exercise;Balance training   PT Next Visit Plan Check HEP and progress/modify as tolerated.   Consulted and Agree with Plan of Care Patient      Patient will benefit from skilled therapeutic intervention in order to improve the following deficits and impairments:  Abnormal gait, Dizziness, Decreased balance  Visit Diagnosis: BPPV (benign paroxysmal positional vertigo), left  Dizziness and giddiness  Other abnormalities of gait and  mobility  Unsteadiness on feet     Problem List Patient Active Problem List   Diagnosis Date Noted  . Vertigo, benign positional 06/13/2015  . Peripheral neuropathy ([place]) 06/13/2015    [place], PT 07/11/2015, 2:16 PM  [facility] [address] [place], [place], [address] Phone: [phone]   Fax:  [phone]  Name: Keonte Standfield MRN: [id number] Date of Birth: 09-Nov-1950

## 2015-07-12 ENCOUNTER — Ambulatory Visit: Payer: Commercial Managed Care - HMO | Admitting: Physical Therapy

## 2015-07-16 ENCOUNTER — Encounter: Payer: 59 | Admitting: Rehabilitative and Restorative Service Providers"

## 2015-07-18 ENCOUNTER — Encounter: Payer: Self-pay | Admitting: Neurology

## 2015-07-18 ENCOUNTER — Ambulatory Visit (INDEPENDENT_AMBULATORY_CARE_PROVIDER_SITE_OTHER): Payer: Commercial Managed Care - HMO | Admitting: Neurology

## 2015-07-18 VITALS — BP 109/80 | HR 84 | Resp 16 | Ht 64.5 in | Wt 161.0 lb

## 2015-07-18 DIAGNOSIS — G43709 Chronic migraine without aura, not intractable, without status migrainosus: Secondary | ICD-10-CM

## 2015-07-18 DIAGNOSIS — G6289 Other specified polyneuropathies: Secondary | ICD-10-CM | POA: Diagnosis not present

## 2015-07-18 DIAGNOSIS — H8112 Benign paroxysmal vertigo, left ear: Secondary | ICD-10-CM | POA: Diagnosis not present

## 2015-07-18 DIAGNOSIS — IMO0002 Reserved for concepts with insufficient information to code with codable children: Secondary | ICD-10-CM | POA: Insufficient documentation

## 2015-07-18 MED ORDER — RIZATRIPTAN BENZOATE 5 MG PO TBDP: 5.0000 mg | ORAL_TABLET | ORAL | Status: DC | PRN

## 2015-07-18 NOTE — Progress Notes (Addendum)
Chief Complaint  Patient presents with  . Peripheral Neuropathy    Rm 4. F/U. Patient reports that Vestibular Rebad has helped her dizziness. Only taking Gabapenin 200mg  at night. No new concerns.   . Dizziness      PATIENT: Marilyn Alvarez DOB: [birth year]  Chief Complaint  Patient presents with  . Peripheral Neuropathy    Rm 4. F/U. Patient reports that Vestibular Rebad has helped her dizziness. Only taking Gabapenin 200mg  at night. No new concerns.   . Dizziness     HISTORICAL  Marilyn Alvarez is a 65 years old right-handed female, alone at today's clinical visit, seen in refer by her primary care physician Dr.  Glenda Chroman in June 13 2015 for evaluation of dizziness.  Vertigo: She had acute onset Vertigo in April 14 2015, after she woke up from overnight sleep, turning to the left side, she experienced acute onset spinning sensation, nausea, unsteady gait, improved by visual suppression, intermittent for one week,  Ever since the initial event, with sudden positional change, especially when lying down at nighttime turning towards the left side, she would experience transient severe vertigo, she denies hearing change, no tinnitus   I have personally reviewed MRI of the brain without contrast in March 2017 from [facility], there was no significant abnormality noticed.  Migraine headaches: Chronic, lateralized pounding headache, with light noise sensitivity, nauseous, she has been taking Tylenol as needed, which has been helpful, tried over-the-counter ibuprofen without benefit, she has migraine headaches about 3-4 times each month. With her new-onset Vertigo, she often has moderate to severe headache afterwards.   Peripheral neuropathy: She had a history of gradual onset bilateral feet paresthesia since 2002, her mother also has diabetic peripheral neuropathy, daughter has type 1 diabetes suffered diabetic peripheral neuropathy, she presented with bilateral plantar feet coldness  sensation, numbness tingling, slow progressive over the past decade, now also involving top of her feet, and occasionally bilateral fingertips paresthesia, recently she also experienced hot flashes, starting from bilateral feet numbness tingling very uncomfortable sensation, spreading forward with a hot flash, couple episodes a day,  UPDATE May 25th 2017: Migraine: she still has intermittent right reorbital headaches, with associated light noise sensitivity, lasting for hours, triggered by weather change, she has migraines about 4-5 times each months, she has tried Tylenol, but often with suboptimal control even with 3 tablets at one time,   Peripheral neuropathy, she continue has bilateral feet paresthesia, episode of flushing sensation from bottom of her feet rising up, she has tried gabapentin 100 mg 2 tablets every night, which has helped her symptoms some, also help her flushing, she has mild sleepiness with gabapentin  Benign positional vertigo: She is receiving vestibular rehabilitation therapy, her symptoms has much improved  REVIEW OF SYSTEMS: Full 14 system review of systems performed and notable only for ear pain, murmur, painful urination, urinary urgency, numbness  ALLERGIES: Allergies  Allergen Reactions  . Latex Other (See Comments)    Blisters  . Thimerosal     HOME MEDICATIONS: Current Outpatient Prescriptions  Medication Sig Dispense Refill  . clindamycin (CLEOCIN) 300 MG capsule Take 300 mg by mouth 2 (two) times daily.    . diazepam (VALIUM) 5 MG tablet Take 5 mg by mouth every 12 (twelve) hours as needed.     . gabapentin (NEURONTIN) 100 MG capsule Take 3 capsules (300 mg total) by mouth 3 (three) times daily. 270 capsule 11  . Meclizine HCl 25 MG CHEW Chew 25 mg by mouth  3 (three) times daily as needed. Reported on 07/02/2015    . promethazine (PHENERGAN) 25 MG tablet Take 25 mg by mouth 2 (two) times daily as needed.      No current facility-administered medications  for this visit.    PAST MEDICAL HISTORY: Past Medical History  Diagnosis Date  . Peripheral neuropathy ([place])   . Migraine   . Vertigo     PAST SURGICAL HISTORY: Past Surgical History  Procedure Laterality Date  . Abdominal hysterectomy    . Cholecystectomy    . Oopherectomy    . Shoulder surgery    . Iud removal      FAMILY HISTORY: Family History  Problem Relation Age of Onset  . Neuropathy Mother   . Diabetes Mother   . Diabetes Brother   . Lung cancer Father   . Diabetes Daughter     SOCIAL HISTORY:  Social History   Social History  . Marital Status: Married    Spouse Name: N/A  . Number of Children: 3  . Years of Education: Masters   Occupational History  . Retired    Social History Main Topics  . Smoking status: Never Smoker   . Smokeless tobacco: Not on file  . Alcohol Use: No  . Drug Use: No  . Sexual Activity: Yes   Other Topics Concern  . Not on file   Social History Narrative   Lives at home with husband and her mother.   Right-handed.   2-3 cups caffeine daily.     PHYSICAL EXAM   Filed Vitals:   07/18/15 1359  BP: 109/80  Pulse: 84  Resp: 16  Height: 5' 4.5" (1.638 m)  Weight: 161 lb (73.029 kg)    Not recorded      Body mass index is 27.22 kg/(m^2).  PHYSICAL EXAMNIATION:  Gen: NAD, conversant, well nourised, obese, well groomed                     Cardiovascular: Regular rate rhythm, no peripheral edema, warm, nontender. Eyes: Conjunctivae clear without exudates or hemorrhage Neck: Supple, no carotid bruise. Pulmonary: Clear to auscultation bilaterally   NEUROLOGICAL EXAM:  MENTAL STATUS: Speech:    Speech is normal; fluent and spontaneous with normal comprehension.  Cognition:     Orientation to time, place and person     Normal recent and remote memory     Normal Attention span and concentration     Normal Language, naming, repeating,spontaneous speech     Fund of knowledge   CRANIAL NERVES: CN II:  Visual fields are full to confrontation. Fundoscopic exam is normal with sharp discs and no vascular changes. Pupils are round equal and briskly reactive to light. CN III, IV, VI: extraocular movement are normal. No ptosis. CN V: Facial sensation is intact to pinprick in all 3 divisions bilaterally. Corneal responses are intact.  CN VII: Face is symmetric with normal eye closure and smile. CN VIII: Hearing is normal to rubbing fingers, bilateral tympanic membrane were intact. CN IX, X: Palate elevates symmetrically. Phonation is normal. CN XI: Head turning and shoulder shrug are intact CN XII: Tongue is midline with normal movements and no atrophy.  MOTOR: There is no pronator drift of out-stretched arms. Muscle bulk and tone are normal. Muscle strength is normal.  REFLEXES: Reflexes are 2+ and symmetric at the biceps, triceps, knees, and decreased at ankles. Plantar responses are flexor.  SENSORY: Length dependent decreased to light touch, pinprick and vibratory  sensation to ankle level.  COORDINATION: Rapid alternating movements and fine finger movements are intact. There is no dysmetria on finger-to-nose and heel-knee-shin.    GAIT/STANCE: Narrow based steady, she is able to perform tiptoe heel walking, mildly difficulty with tandem.  DIAGNOSTIC DATA (LABS, IMAGING, TESTING) - I reviewed patient records, labs, notes, testing and imaging myself where available.   ASSESSMENT AND PLAN  Marilyn Alvarez is a 65 y.o. female    Benign positional vertigo  Most likely involving left posterior semicircular canal  Has much improved with repositioning maneuver and vestibular rehabilitation  Migraine headaches  She has 4-5 migraine headaches each months, failed response to Tylenol  Maxalt 5 mg along with Phenergan 25 mg as needed,   Peripheral neuropathy  Most suggestive of small fiber peripheral neuropathy  Keep gabapentin 100 mg 2 tablets every night   EMG nerve conduction  study  Laboratory results from her primary care physician  Marcial Pacas, M.D. Ph.D.  [facility] [address] [place], [place] [address] Ph: [phone] Fax: [phone]  CC: Glenda Chroman, MD  Laboratory evaluation he June 10 2015, normal CMP with exception of elevated glucose 107, normal TSH 2.47, normal CBC with hemoglobin of 13 point 7,

## 2015-07-19 ENCOUNTER — Ambulatory Visit: Payer: Commercial Managed Care - HMO | Admitting: Rehabilitative and Restorative Service Providers"

## 2015-07-19 ENCOUNTER — Encounter: Payer: Self-pay | Admitting: Rehabilitative and Restorative Service Providers"

## 2015-07-19 DIAGNOSIS — R2689 Other abnormalities of gait and mobility: Secondary | ICD-10-CM

## 2015-07-19 DIAGNOSIS — H8112 Benign paroxysmal vertigo, left ear: Secondary | ICD-10-CM | POA: Diagnosis not present

## 2015-07-19 DIAGNOSIS — R42 Dizziness and giddiness: Secondary | ICD-10-CM

## 2015-07-19 NOTE — Therapy (Signed)
[facility] [address] [place], [place], [address] Phone: [phone]   Fax:  [phone]  Physical Therapy Treatment  Patient Details  Name: Meriam Chojnowski MRN: [id number] Date of Birth: [birth year] Referring Provider: Marcial Pacas, MD  Encounter Date: 07/19/2015      PT End of Session - 07/19/15 1242    Visit Number 3   Number of Visits 9  eval + 8 visits   Date for PT Re-Evaluation 08/01/15   Authorization Type [employer]   PT Start Time 1240   PT Stop Time 1300   PT Time Calculation (min) 20 min   Activity Tolerance Other (comment)  Pt limited by nausea.   Behavior During Therapy [facility] for tasks assessed/performed      Past Medical History  Diagnosis Date  . Peripheral neuropathy ([place])   . Migraine   . Vertigo     Past Surgical History  Procedure Laterality Date  . Abdominal hysterectomy    . Cholecystectomy    . Oopherectomy    . Shoulder surgery    . Iud removal      There were no vitals filed for this visit.      Subjective Assessment - 07/19/15 1240    Subjective The patient reports vertigo is 100% resolved and that during eye exercise she still notes minimal visual blurring.  She has had R ear pain due to tooth infection.   Patient Stated Goals "I just want to live a normal life. I like to be able to look for fossils, but I can't do. that."   Currently in Pain? No/denies                Vestibular Assessment - 07/19/15 1244    Positional Testing   Sidelying Test Sidelying Right;Sidelying Left   Horizontal Canal Testing Horizontal Canal Right;Horizontal Canal Left   Sidelying Right   Sidelying Right Duration none   Sidelying Right Symptoms No nystagmus   Sidelying Left   Sidelying Left Duration none   Sidelying Left Symptoms No nystagmus   Horizontal Canal Right   Horizontal Canal Right Duration none   Horizontal Canal Right Symptoms Normal   Horizontal Canal Left   Horizontal Canal Left Duration none   Horizontal Canal Left Symptoms Normal                  Vestibular Treatment/Exercise - 07/19/15 1254    Vestibular Treatment/Exercise   Vestibular Treatment Provided Habituation;Gaze   Habituation Exercises Laruth Bouchard Daroff;Horizontal Roll   Gaze Exercises X1 Viewing Horizontal   Brandt Daroff   Number of Reps  2   Symptom Description  no dizziness, recommended d/c from HEP   Horizontal Roll   Number of Reps  2   Symptom Description  no dizziness, recommended d/c from HEP   X1 Viewing Horizontal   Foot Position standing feet apart   Comments performed 30 seconds with PT cues on more deliberate head motion with larger amplitude.  Provided cues on how to chin tuck without limiting view from frame of lens wear.  Recommended continue x 4-6 more weeks as patient still has difficulty performing with clear gaze.      SELF CARE/HOME MANAGEMENT: Discussed continuation of HEP and how to manage recurring BPPV at home or return to therapy.           PT Short Term Goals - 07/02/15 0923    PT SHORT TERM GOAL #1   Title STG's = LTG's  PT SHORT TERM GOAL #2   Title --   PT SHORT TERM GOAL #3   Title --           PT Long Term Goals - 07/19/15 1253    PT LONG TERM GOAL #1   Title Positional vertigo testing will be negative to indicate resolved BPPV.  (Target date: 07/30/15)   Baseline Patient without dizziness and nystagmus with positional testing.   Status Achieved   PT LONG TERM GOAL #2   Title Pt will decrease DHI score from 62 to < / = 44 to indicate significant decrease in pt-perceived disability due to dizziness   Baseline Patient improved from 62% to 0%.   Status Achieved   PT LONG TERM GOAL #3   Title Assess strength and balance, if indicated, after vertigo clears.    Baseline Patient reports return to baseline without imbalance noted   Status Deferred               Plan - 07/19/15 1253    Clinical Impression  Statement The patient met LTGs.  She feels vertigo resolved and still c/o visual blurring with gaze exercises, which indicate need to continue.  Patient to perform x 4-6 more week.   PT Treatment/Interventions ADLs/Self Care Home Management;Gait training;Canalith Repostioning;Vestibular;DME Instruction;Neuromuscular re-education;Stair training;Functional mobility training;Therapeutic activities;Patient/family education;Therapeutic exercise;Balance training   PT Next Visit Plan discharge today.   Consulted and Agree with Plan of Care Patient      Patient will benefit from skilled therapeutic intervention in order to improve the following deficits and impairments:  Abnormal gait, Dizziness, Decreased balance  Visit Diagnosis: BPPV (benign paroxysmal positional vertigo), left  Dizziness and giddiness  Other abnormalities of gait and mobility     Problem List Patient Active Problem List   Diagnosis Date Noted  . Chronic migraine 07/18/2015  . Vertigo, benign positional 06/13/2015  . Peripheral neuropathy ([place]) 06/13/2015    Jarica Plass, PT 07/19/2015, 1:03 PM  [facility] [address] [place], [place], [address] Phone: [phone]   Fax:  [phone]  Name: Genifer Lazenby MRN: [id number] Date of Birth: 08-25-1950

## 2015-07-19 NOTE — Therapy (Signed)
[facility] [address] [place], [place], [address] Phone: [phone]   Fax:  [phone]  Patient Details  Name: Marilyn Alvarez MRN: [id number] Date of Birth: [birth year] Referring Provider:  No ref. provider found  Encounter Date: 07/19/2015  PHYSICAL THERAPY DISCHARGE SUMMARY  Visits from Start of Care: 3  Current functional level related to goals / functional outcomes:     PT Long Term Goals - 07/19/15 1253    PT LONG TERM GOAL #1   Title Positional vertigo testing will be negative to indicate resolved BPPV.  (Target date: 07/30/15)   Baseline Patient without dizziness and nystagmus with positional testing.   Status Achieved   PT LONG TERM GOAL #2   Title Pt will decrease DHI score from 62 to < / = 44 to indicate significant decrease in pt-perceived disability due to dizziness   Baseline Patient improved from 62% to 0%.   Status Achieved   PT LONG TERM GOAL #3   Title Assess strength and balance, if indicated, after vertigo clears.    Baseline Patient reports return to baseline without imbalance noted   Status Deferred        Remaining deficits: Visual blurring during gaze exercises.   Education / Equipment: HEP , home mgmt of BPPV Plan: Patient agrees to discharge.  Patient goals were met. Patient is being discharged due to meeting the stated rehab goals.  ?????        Thank you for the referral of this patient. Rudell Cobb, MPT   Alizon Schmeling 07/19/2015, 1:05 PM  [facility] [address] [place], [place], [address] Phone: [phone]   Fax:  [phone]

## 2015-07-24 ENCOUNTER — Encounter: Payer: 59 | Admitting: Rehabilitative and Restorative Service Providers"

## 2015-07-26 ENCOUNTER — Encounter: Payer: 59 | Admitting: Physical Therapy

## 2015-07-30 ENCOUNTER — Encounter: Payer: 59 | Admitting: Physical Therapy

## 2015-08-01 ENCOUNTER — Encounter: Payer: 59 | Admitting: Physical Therapy

## 2015-08-06 ENCOUNTER — Encounter: Payer: 59 | Admitting: Physical Therapy

## 2015-08-09 ENCOUNTER — Encounter: Payer: 59 | Admitting: Physical Therapy

## 2015-08-19 ENCOUNTER — Ambulatory Visit (INDEPENDENT_AMBULATORY_CARE_PROVIDER_SITE_OTHER): Payer: Self-pay | Admitting: Neurology

## 2015-08-19 ENCOUNTER — Ambulatory Visit (INDEPENDENT_AMBULATORY_CARE_PROVIDER_SITE_OTHER): Payer: Commercial Managed Care - HMO | Admitting: Neurology

## 2015-08-19 DIAGNOSIS — R202 Paresthesia of skin: Secondary | ICD-10-CM

## 2015-08-19 DIAGNOSIS — H8112 Benign paroxysmal vertigo, left ear: Secondary | ICD-10-CM

## 2015-08-19 DIAGNOSIS — G43709 Chronic migraine without aura, not intractable, without status migrainosus: Secondary | ICD-10-CM

## 2015-08-19 DIAGNOSIS — G6289 Other specified polyneuropathies: Secondary | ICD-10-CM

## 2015-08-19 DIAGNOSIS — R7309 Other abnormal glucose: Secondary | ICD-10-CM | POA: Diagnosis not present

## 2015-08-19 DIAGNOSIS — IMO0002 Reserved for concepts with insufficient information to code with codable children: Secondary | ICD-10-CM

## 2015-08-19 DIAGNOSIS — Z0289 Encounter for other administrative examinations: Secondary | ICD-10-CM

## 2015-08-19 NOTE — Progress Notes (Signed)
No chief complaint on file.     PATIENT: Marilyn Alvarez DOB: April 28, 1950  No chief complaint on file.    HISTORICAL  Avishka Statler is a 65 years old right-handed female, alone at today's clinical visit, seen in refer by her primary care physician Dr.  Glenda Chroman in June 13 2015 for evaluation of dizziness.  Vertigo: She had acute onset Vertigo in April 14 2015, after she woke up from overnight sleep, turning to the left side, she experienced acute onset spinning sensation, nausea, unsteady gait, improved by visual suppression, intermittent for one week,  Ever since the initial event, with sudden positional change, especially when lying down at nighttime turning towards the left side, she would experience transient severe vertigo, she denies hearing change, no tinnitus   I have personally reviewed MRI of the brain without contrast in March 2017 from [facility], there was no significant abnormality noticed.  Migraine headaches: Chronic, lateralized pounding headache, with light noise sensitivity, nauseous, she has been taking Tylenol as needed, which has been helpful, tried over-the-counter ibuprofen without benefit, she has migraine headaches about 3-4 times each month. With her new-onset Vertigo, she often has moderate to severe headache afterwards.   Peripheral neuropathy: She had a history of gradual onset bilateral feet paresthesia since 2002, her mother also has diabetic peripheral neuropathy, daughter has type 1 diabetes suffered diabetic peripheral neuropathy, she presented with bilateral plantar feet coldness sensation, numbness tingling, slow progressive over the past decade, now also involving top of her feet, and occasionally bilateral fingertips paresthesia, recently she also experienced hot flashes, starting from bilateral feet numbness tingling very uncomfortable sensation, spreading forward with a hot flash, couple episodes a day,  UPDATE May 25th 2017: Migraine: she  still has intermittent right reorbital headaches, with associated light noise sensitivity, lasting for hours, triggered by weather change, she has migraines about 4-5 times each months, she has tried Tylenol, but often with suboptimal control even with 3 tablets at one time,   Peripheral neuropathy, she continue has bilateral feet paresthesia, episode of flushing sensation from bottom of her feet rising up, she has tried gabapentin 100 mg 2 tablets every night, which has helped her symptoms some, also help her flushing, she has mild sleepiness with gabapentin  Benign positional vertigo: She is receiving vestibular rehabilitation therapy, her symptoms has much improved  UPDATE June 26th 2017;  Laboratory evaluation in  June 10 2015, normal CMP with exception of elevated glucose 107, normal TSH 2.47, normal CBC with hemoglobin of 13 point 7,    REVIEW OF SYSTEMS: Full 14 system review of systems performed and notable only for ear pain, murmur, painful urination, urinary urgency, numbness  ALLERGIES: Allergies  Allergen Reactions  . Latex Other (See Comments)    Blisters  . Thimerosal     HOME MEDICATIONS: Current Outpatient Prescriptions  Medication Sig Dispense Refill  . clindamycin (CLEOCIN) 300 MG capsule Take 300 mg by mouth 2 (two) times daily.    . diazepam (VALIUM) 5 MG tablet Take 5 mg by mouth every 12 (twelve) hours as needed.     . gabapentin (NEURONTIN) 100 MG capsule Take 3 capsules (300 mg total) by mouth 3 (three) times daily. 270 capsule 11  . Meclizine HCl 25 MG CHEW Chew 25 mg by mouth 3 (three) times daily as needed. Reported on 07/02/2015    . promethazine (PHENERGAN) 25 MG tablet Take 25 mg by mouth 2 (two) times daily as needed.     Marland Kitchen  rizatriptan (MAXALT-MLT) 5 MG disintegrating tablet Take 1 tablet (5 mg total) by mouth as needed. May repeat in 2 hours if needed 15 tablet 6   No current facility-administered medications for this visit.    PAST MEDICAL  HISTORY: Past Medical History  Diagnosis Date  . Peripheral neuropathy ([place])   . Migraine   . Vertigo     PAST SURGICAL HISTORY: Past Surgical History  Procedure Laterality Date  . Abdominal hysterectomy    . Cholecystectomy    . Oopherectomy    . Shoulder surgery    . Iud removal      FAMILY HISTORY: Family History  Problem Relation Age of Onset  . Neuropathy Mother   . Diabetes Mother   . Diabetes Brother   . Lung cancer Father   . Diabetes Daughter     SOCIAL HISTORY:  Social History   Social History  . Marital Status: Married    Spouse Name: N/A  . Number of Children: 3  . Years of Education: Masters   Occupational History  . Retired    Social History Main Topics  . Smoking status: Never Smoker   . Smokeless tobacco: Not on file  . Alcohol Use: No  . Drug Use: No  . Sexual Activity: Yes   Other Topics Concern  . Not on file   Social History Narrative   Lives at home with husband and her mother.   Right-handed.   2-3 cups caffeine daily.     PHYSICAL EXAM   There were no vitals filed for this visit.  Not recorded      There is no weight on file to calculate BMI.  PHYSICAL EXAMNIATION:  Gen: NAD, conversant, well nourised, obese, well groomed                     Cardiovascular: Regular rate rhythm, no peripheral edema, warm, nontender. Eyes: Conjunctivae clear without exudates or hemorrhage Neck: Supple, no carotid bruise. Pulmonary: Clear to auscultation bilaterally   NEUROLOGICAL EXAM:  MENTAL STATUS: Speech:    Speech is normal; fluent and spontaneous with normal comprehension.  Cognition:     Orientation to time, place and person     Normal recent and remote memory     Normal Attention span and concentration     Normal Language, naming, repeating,spontaneous speech     Fund of knowledge   CRANIAL NERVES: CN II: Visual fields are full to confrontation. Fundoscopic exam is normal with sharp discs and no vascular changes.  Pupils are round equal and briskly reactive to light. CN III, IV, VI: extraocular movement are normal. No ptosis. CN V: Facial sensation is intact to pinprick in all 3 divisions bilaterally. Corneal responses are intact.  CN VII: Face is symmetric with normal eye closure and smile. CN VIII: Hearing is normal to rubbing fingers, bilateral tympanic membrane were intact. CN IX, X: Palate elevates symmetrically. Phonation is normal. CN XI: Head turning and shoulder shrug are intact CN XII: Tongue is midline with normal movements and no atrophy.  MOTOR: There is no pronator drift of out-stretched arms. Muscle bulk and tone are normal. Muscle strength is normal.  REFLEXES: Reflexes are 2+ and symmetric at the biceps, triceps, knees, and decreased at ankles. Plantar responses are flexor.  SENSORY: Length dependent decreased to light touch, pinprick and vibratory sensation to ankle level.  COORDINATION: Rapid alternating movements and fine finger movements are intact. There is no dysmetria on finger-to-nose and heel-knee-shin.  GAIT/STANCE: Narrow based steady, she is able to perform tiptoe heel walking, mildly difficulty with tandem.  DIAGNOSTIC DATA (LABS, IMAGING, TESTING) - I reviewed patient records, labs, notes, testing and imaging myself where available.   ASSESSMENT AND PLAN  Francies Montagnino is a 65 y.o. female    Benign positional vertigo  Most likely involving left posterior semicircular canal  Has much improved with repositioning maneuver and vestibular rehabilitation  Migraine headaches  She has 4-5 migraine headaches each months, failed response to Tylenol  Maxalt 5 mg along with Phenergan 25 mg as needed,   Peripheral neuropathy  Most suggestive of small fiber peripheral neuropathy  Keep gabapentin 100 mg 2 tablets every night   EMG nerve conduction study  Laboratory results from her primary care physician  Marcial Pacas, M.D. Ph.D.  [facility] [address], [place], [place] [address] Ph: [phone] Fax: [phone]  CC: Glenda Chroman, MD

## 2015-08-19 NOTE — Progress Notes (Signed)
See EMG nerve conduction study report under procedure section

## 2015-08-19 NOTE — Procedures (Signed)
   NCS (NERVE CONDUCTION STUDY) WITH EMG (ELECTROMYOGRAPHY) REPORT   STUDY DATE: August 19 2015 PATIENT NAME: Marilyn Alvarez DOB: Nov 14, 1950 MRN: [id number]    TECHNOLOGIST: Laretta Alstrom   ELECTROMYOGRAPHER: Marcial Pacas M.D.  CLINICAL INFORMATION:  65 years old female, complains of bilateral feet paresthesia shooting pain  FINDINGS: NERVE CONDUCTION STUDY: Bilateral peroneal sensory responses were normal. Bilateral peroneal to EDB and tibial motor responses were normal. Bilateral tibial H reflexes were normal and symmetric.   NEEDLE ELECTROMYOGRAPHY: Selective needle examinations were performed at bilateral lower extremity muscles bilateral lumbar sacral paraspinal muscles.  Needle examination of bilateral tibialis anterior, tibialis posterior, medial gastrocnemius, peroneal longus, vastus lateralis, right biceps femoris long head was normal.  There was no spontaneous activity at bilateral lumbar sacral paraspinal muscles, bilateral L4-5 S1.  IMPRESSION:   This is a normal study. There is no electrodiagnostic evidence of large fiber peripheral neuropathy or bilateral lumbosacral radiculopathy.    INTERPRETING PHYSICIAN:   Marcial Pacas M.D. Ph.D. [facility] [address] [place], [place] [address] [phone]

## 2015-08-21 LAB — PROTEIN ELECTROPHORESIS
A/G RATIO SPE: 1.3 (ref 0.7–1.7)
ALBUMIN ELP: 4.3 g/dL (ref 2.9–4.4)
ALPHA 2: 0.8 g/dL (ref 0.4–1.0)
Alpha 1: 0.2 g/dL (ref 0.0–0.4)
BETA: 1.2 g/dL (ref 0.7–1.3)
Gamma Globulin: 1.1 g/dL (ref 0.4–1.8)
Globulin, Total: 3.2 g/dL (ref 2.2–3.9)
TOTAL PROTEIN: 7.5 g/dL (ref 6.0–8.5)

## 2015-08-21 LAB — FOLATE: Folate: 15.7 ng/mL (ref >3.0)

## 2015-08-21 LAB — C-REACTIVE PROTEIN: CRP: 2.2 mg/L (ref 0.0–4.9)

## 2015-08-21 LAB — SEDIMENTATION RATE: Sed Rate: 8 mm/hr (ref 0–40)

## 2015-08-21 LAB — VITAMIN B12: VITAMIN B 12: 661 pg/mL (ref 211–946)

## 2015-08-21 LAB — ANA W/REFLEX IF POSITIVE: Anti Nuclear Antibody(ANA): NEGATIVE

## 2015-08-21 LAB — HGB A1C W/O EAG: Hgb A1c MFr Bld: 5.8 % — ABNORMAL HIGH (ref 4.8–5.6)

## 2015-11-20 ENCOUNTER — Ambulatory Visit: Payer: Commercial Managed Care - HMO | Admitting: Neurology

## 2015-12-23 ENCOUNTER — Encounter: Payer: Self-pay | Admitting: Neurology

## 2015-12-23 ENCOUNTER — Ambulatory Visit (INDEPENDENT_AMBULATORY_CARE_PROVIDER_SITE_OTHER): Payer: Commercial Managed Care - HMO | Admitting: Neurology

## 2015-12-23 VITALS — BP 128/80 | HR 70 | Ht 64.5 in | Wt 162.0 lb

## 2015-12-23 DIAGNOSIS — G6289 Other specified polyneuropathies: Secondary | ICD-10-CM | POA: Diagnosis not present

## 2015-12-23 DIAGNOSIS — R7309 Other abnormal glucose: Secondary | ICD-10-CM | POA: Diagnosis not present

## 2015-12-23 DIAGNOSIS — H8112 Benign paroxysmal vertigo, left ear: Secondary | ICD-10-CM

## 2015-12-23 DIAGNOSIS — G43709 Chronic migraine without aura, not intractable, without status migrainosus: Secondary | ICD-10-CM | POA: Diagnosis not present

## 2015-12-23 DIAGNOSIS — IMO0002 Reserved for concepts with insufficient information to code with codable children: Secondary | ICD-10-CM

## 2015-12-23 MED ORDER — MECLIZINE HCL 25 MG PO CHEW: 25.0000 mg | CHEWABLE_TABLET | Freq: Three times a day (TID) | ORAL | 6 refills | Status: DC | PRN

## 2015-12-23 MED ORDER — PROMETHAZINE HCL 25 MG PO TABS: 25.0000 mg | ORAL_TABLET | Freq: Two times a day (BID) | ORAL | 6 refills | Status: AC | PRN

## 2015-12-23 NOTE — Patient Instructions (Signed)
Magnesium oxide 400 mg Riboflavin 100 mg=Vit B12  Twice a day

## 2015-12-23 NOTE — Progress Notes (Signed)
Chief Complaint  Patient presents with  . Migraine    Reports getting a migraine once weekly that responds well to Maxalt.  . Dizziness    States dizziness has not been a problem recently.  . Peripheral Neuropathy    Her feet are still burning, especially at night.  She is only taking the gabapentin on a prn basis right now.  Her hands only hurt when it is cold.      PATIENT: Marilyn Alvarez DOB: 02/18/1951  Chief Complaint  Patient presents with  . Migraine    Reports getting a migraine once weekly that responds well to Maxalt.  . Dizziness    States dizziness has not been a problem recently.  . Peripheral Neuropathy    Her feet are still burning, especially at night.  She is only taking the gabapentin on a prn basis right now.  Her hands only hurt when it is cold.     HISTORICAL  Marilyn Alvarez is a 65 years old right-handed female, alone at today's clinical visit, seen in refer by her primary care physician Dr.  Glenda Chroman For evaluation of constellation of symptoms, initial evaluation was in April 2017  Vertigo: She had acute onset Vertigo in April 14 2015, after she woke up from overnight sleep, turning to the left side, she experienced acute onset spinning sensation, nausea, unsteady gait, improved by visual suppression, intermittent for one week,  Ever since the initial event, with sudden positional change, especially when lying down at nighttime turning towards the left side, she would experience transient severe vertigo, she denies hearing change, no tinnitus   I have personally reviewed MRI of the brain without contrast in March 2017 from [facility], there was no significant abnormality noticed.  Migraine headaches: Chronic, lateralized pounding headache, with light noise sensitivity, nauseous, she has been taking Tylenol as needed, which has been helpful, tried over-the-counter ibuprofen without benefit, she has migraine headaches about 3-4 times each month. With  her new-onset Vertigo, she often has moderate to severe headache afterwards.   Peripheral neuropathy: She had a history of gradual onset bilateral feet paresthesia since 2002, her mother also has diabetic peripheral neuropathy, daughter has type 1 diabetes suffered diabetic peripheral neuropathy, she presented with bilateral plantar feet coldness sensation, numbness tingling, slow progressive over the past decade, now also involving top of her feet, and occasionally bilateral fingertips paresthesia, recently she also experienced hot flashes, starting from bilateral feet numbness tingling very uncomfortable sensation, spreading forward with a hot flash, couple episodes a day,  UPDATE May 25th 2017: Migraine: she still has intermittent right reorbital headaches, with associated light noise sensitivity, lasting for hours, triggered by weather change, she has migraines about 4-5 times each months, she has tried Tylenol, but often with suboptimal control even with 3 tablets at one time,   Peripheral neuropathy, she continue has bilateral feet paresthesia, episode of flushing sensation from bottom of her feet rising up, she has tried gabapentin 100 mg 2 tablets every night, which has helped her symptoms some, also help her flushing, she has mild sleepiness with gabapentin  Benign positional vertigo: She is receiving vestibular rehabilitation therapy, her symptoms has much improved  UPDATE Dec 23 2015: Reviewed laboratory evaluation in June 2017, normal protein electrophoresis, A1c was mildly elevated 5.8, normal ESR, C-reactive protein, ANA, folic acid, Vit U31,   Vertigo has much improved. She still has migraine once a week, Maxalt prn has been helpful She still has bottom of her feet  burning hurting. She was taking neurontin 120m, which help her symptoms,   She reported skin biopsy in 2015 by podiatrist, findings consistent with small fiber neuropathy, she also reported very uncomfortable episode of  sudden onset hot sensation starting from bottom of her feet, spreading rostrally, 2-3 episodes each day suggestive of autonomic dysregulation  REVIEW OF SYSTEMS: Full 14 system review of systems performed and notable only for fatigue, flushing, frequent urination, urgency, nausea, murmur, headache, numbness  ALLERGIES: Allergies  Allergen Reactions  . Latex Other (See Comments)    Blisters  . Thimerosal     HOME MEDICATIONS: Current Outpatient Prescriptions  Medication Sig Dispense Refill  . diazepam (VALIUM) 5 MG tablet Take 5 mg by mouth every 12 (twelve) hours as needed.     .Marland KitchenFLUOXETINE HCL PO Take by mouth daily.    .Marland Kitchengabapentin (NEURONTIN) 100 MG capsule Take 3 capsules (300 mg total) by mouth 3 (three) times daily. 270 capsule 11  . Meclizine HCl 25 MG CHEW Chew 25 mg by mouth 3 (three) times daily as needed. Reported on 07/02/2015    . promethazine (PHENERGAN) 25 MG tablet Take 25 mg by mouth 2 (two) times daily as needed.     . rizatriptan (MAXALT-MLT) 5 MG disintegrating tablet Take 1 tablet (5 mg total) by mouth as needed. May repeat in 2 hours if needed 15 tablet 6   No current facility-administered medications for this visit.     PAST MEDICAL HISTORY: Past Medical History:  Diagnosis Date  . Migraine   . Peripheral neuropathy ([place]   . Vertigo     PAST SURGICAL HISTORY: Past Surgical History:  Procedure Laterality Date  . ABDOMINAL HYSTERECTOMY    . CHOLECYSTECTOMY    . IUD REMOVAL    . oopherectomy    . SHOULDER SURGERY      FAMILY HISTORY: Family History  Problem Relation Age of Onset  . Neuropathy Mother   . Diabetes Mother   . Diabetes Brother   . Lung cancer Father   . Diabetes Daughter     SOCIAL HISTORY:  Social History   Social History  . Marital status: Married    Spouse name: N/A  . Number of children: 3  . Years of education: Masters   Occupational History  . Retired    Social History Main Topics  . Smoking status: Never Smoker    . Smokeless tobacco: Not on file  . Alcohol use No  . Drug use: No  . Sexual activity: Yes   Other Topics Concern  . Not on file   Social History Narrative   Lives at home with husband and her mother.   Right-handed.   2-3 cups caffeine daily.     PHYSICAL EXAM   Vitals:   12/23/15 0932  BP: 128/80  Pulse: 70  Weight: 162 lb (73.5 kg)  Height: 5' 4.5" (1.638 m)    Not recorded      Body mass index is 27.38 kg/m.  PHYSICAL EXAMNIATION:  Gen: NAD, conversant, well nourised, obese, well groomed                     Cardiovascular: Regular rate rhythm, no peripheral edema, warm, nontender. Eyes: Conjunctivae clear without exudates or hemorrhage Neck: Supple, no carotid bruise. Pulmonary: Clear to auscultation bilaterally   NEUROLOGICAL EXAM:  MENTAL STATUS: Speech:    Speech is normal; fluent and spontaneous with normal comprehension.  Cognition:     Orientation to  time, place and person     Normal recent and remote memory     Normal Attention span and concentration     Normal Language, naming, repeating,spontaneous speech     Fund of knowledge   CRANIAL NERVES: CN II: Visual fields are full to confrontation. Fundoscopic exam is normal with sharp discs and no vascular changes. Pupils are round equal and briskly reactive to light. CN III, IV, VI: extraocular movement are normal. No ptosis. CN V: Facial sensation is intact to pinprick in all 3 divisions bilaterally. Corneal responses are intact.  CN VII: Face is symmetric with normal eye closure and smile. CN VIII: Hearing is normal to rubbing fingers, bilateral tympanic membrane were intact. CN IX, X: Palate elevates symmetrically. Phonation is normal. CN XI: Head turning and shoulder shrug are intact CN XII: Tongue is midline with normal movements and no atrophy.  MOTOR: There is no pronator drift of out-stretched arms. Muscle bulk and tone are normal. Muscle strength is normal.  REFLEXES: Reflexes are 2+  and symmetric at the biceps, triceps, knees, and decreased at ankles. Plantar responses are flexor.  SENSORY: Length dependent decreased to light touch, pinprick and vibratory sensation to ankle level.  COORDINATION: Rapid alternating movements and fine finger movements are intact. There is no dysmetria on finger-to-nose and heel-knee-shin.    GAIT/STANCE: Narrow based steady, she is able to perform tiptoe heel walking, mildly difficulty with tandem.  DIAGNOSTIC DATA (LABS, IMAGING, TESTING) - I reviewed patient records, labs, notes, testing and imaging myself where available.   ASSESSMENT AND PLAN  Marilyn Alvarez is a 65 y.o. female    Benign positional vertigo  Most likely involving left posterior semicircular canal  Has much improved with repositioning maneuver and vestibular rehabilitation  Migraine headaches  She has 4-5 migraine headaches each months, failed response to Tylenol  Maxalt 5 mg along with Phenergan 25 mg as needed,   Peripheral neuropathy  Most suggestive of small fiber peripheral neuropathy, with autonomic involvement  Mild abnormal elevated A1c 5.8, but would not explain the progressive course, and significant symptoms.  I have advised patient continue OBSERVE her symptoms, if it progressively gets worse, I may consider more aggressive evaluation such as lumbar puncture, more extensive laboratory evaluations   Gabapentin 145m tid  YMarcial Pacas M.D. Ph.D.  [facility] [address] [place] [address]Ph: [phone]: [phone] CC: DGlenda Chroman MD  Laboratory evaluation he June 10 2015, normal CMP with exception of elevated glucose 107, normal TSH 2.47, normal CBC with hemoglobin of 13 point 7,

## 2016-06-24 ENCOUNTER — Ambulatory Visit: Payer: Commercial Managed Care - HMO | Admitting: Neurology

## 2016-09-09 DIAGNOSIS — E7800 Pure hypercholesterolemia, unspecified: Secondary | ICD-10-CM | POA: Diagnosis not present

## 2017-05-10 DIAGNOSIS — Z789 Other specified health status: Secondary | ICD-10-CM | POA: Diagnosis not present

## 2017-05-10 DIAGNOSIS — E7800 Pure hypercholesterolemia, unspecified: Secondary | ICD-10-CM | POA: Diagnosis not present

## 2017-05-10 DIAGNOSIS — H6980 Other specified disorders of Eustachian tube, unspecified ear: Secondary | ICD-10-CM | POA: Diagnosis not present

## 2017-05-10 DIAGNOSIS — Z299 Encounter for prophylactic measures, unspecified: Secondary | ICD-10-CM | POA: Diagnosis not present

## 2017-05-10 DIAGNOSIS — Z6829 Body mass index (BMI) 29.0-29.9, adult: Secondary | ICD-10-CM | POA: Diagnosis not present

## 2017-05-10 DIAGNOSIS — Z2821 Immunization not carried out because of patient refusal: Secondary | ICD-10-CM | POA: Diagnosis not present

## 2017-06-03 ENCOUNTER — Ambulatory Visit (INDEPENDENT_AMBULATORY_CARE_PROVIDER_SITE_OTHER): Payer: PPO | Admitting: Otolaryngology

## 2017-06-03 DIAGNOSIS — H93293 Other abnormal auditory perceptions, bilateral: Secondary | ICD-10-CM | POA: Diagnosis not present

## 2017-06-03 DIAGNOSIS — H9202 Otalgia, left ear: Secondary | ICD-10-CM

## 2017-07-01 ENCOUNTER — Ambulatory Visit (INDEPENDENT_AMBULATORY_CARE_PROVIDER_SITE_OTHER): Payer: PPO | Admitting: Otolaryngology

## 2017-07-01 DIAGNOSIS — H9202 Otalgia, left ear: Secondary | ICD-10-CM

## 2017-08-30 DIAGNOSIS — E7800 Pure hypercholesterolemia, unspecified: Secondary | ICD-10-CM | POA: Diagnosis not present

## 2017-08-30 DIAGNOSIS — Z789 Other specified health status: Secondary | ICD-10-CM | POA: Diagnosis not present

## 2017-08-30 DIAGNOSIS — Z6829 Body mass index (BMI) 29.0-29.9, adult: Secondary | ICD-10-CM | POA: Diagnosis not present

## 2017-08-30 DIAGNOSIS — H9209 Otalgia, unspecified ear: Secondary | ICD-10-CM | POA: Diagnosis not present

## 2017-08-30 DIAGNOSIS — Z299 Encounter for prophylactic measures, unspecified: Secondary | ICD-10-CM | POA: Diagnosis not present

## 2017-10-11 DIAGNOSIS — E7800 Pure hypercholesterolemia, unspecified: Secondary | ICD-10-CM | POA: Diagnosis not present

## 2017-10-11 DIAGNOSIS — Z6829 Body mass index (BMI) 29.0-29.9, adult: Secondary | ICD-10-CM | POA: Diagnosis not present

## 2017-10-11 DIAGNOSIS — Z299 Encounter for prophylactic measures, unspecified: Secondary | ICD-10-CM | POA: Diagnosis not present

## 2017-10-11 DIAGNOSIS — R350 Frequency of micturition: Secondary | ICD-10-CM | POA: Diagnosis not present

## 2017-10-11 DIAGNOSIS — N390 Urinary tract infection, site not specified: Secondary | ICD-10-CM | POA: Diagnosis not present

## 2017-11-03 DIAGNOSIS — Z1211 Encounter for screening for malignant neoplasm of colon: Secondary | ICD-10-CM | POA: Diagnosis not present

## 2017-11-03 DIAGNOSIS — Z6830 Body mass index (BMI) 30.0-30.9, adult: Secondary | ICD-10-CM | POA: Diagnosis not present

## 2017-11-03 DIAGNOSIS — Z7189 Other specified counseling: Secondary | ICD-10-CM | POA: Diagnosis not present

## 2017-11-03 DIAGNOSIS — Z299 Encounter for prophylactic measures, unspecified: Secondary | ICD-10-CM | POA: Diagnosis not present

## 2017-11-03 DIAGNOSIS — E7800 Pure hypercholesterolemia, unspecified: Secondary | ICD-10-CM | POA: Diagnosis not present

## 2017-11-03 DIAGNOSIS — Z1339 Encounter for screening examination for other mental health and behavioral disorders: Secondary | ICD-10-CM | POA: Diagnosis not present

## 2017-11-03 DIAGNOSIS — R5383 Other fatigue: Secondary | ICD-10-CM | POA: Diagnosis not present

## 2017-11-03 DIAGNOSIS — Z1331 Encounter for screening for depression: Secondary | ICD-10-CM | POA: Diagnosis not present

## 2017-11-03 DIAGNOSIS — Z79899 Other long term (current) drug therapy: Secondary | ICD-10-CM | POA: Diagnosis not present

## 2017-11-03 DIAGNOSIS — Z0000 Encounter for general adult medical examination without abnormal findings: Secondary | ICD-10-CM | POA: Diagnosis not present

## 2018-04-23 DIAGNOSIS — R05 Cough: Secondary | ICD-10-CM | POA: Diagnosis not present

## 2018-04-23 DIAGNOSIS — J00 Acute nasopharyngitis [common cold]: Secondary | ICD-10-CM | POA: Diagnosis not present

## 2018-06-17 DIAGNOSIS — R42 Dizziness and giddiness: Secondary | ICD-10-CM | POA: Diagnosis not present

## 2018-06-17 DIAGNOSIS — Z299 Encounter for prophylactic measures, unspecified: Secondary | ICD-10-CM | POA: Diagnosis not present

## 2018-11-11 DIAGNOSIS — Z7189 Other specified counseling: Secondary | ICD-10-CM | POA: Diagnosis not present

## 2018-11-11 DIAGNOSIS — R5383 Other fatigue: Secondary | ICD-10-CM | POA: Diagnosis not present

## 2018-11-11 DIAGNOSIS — E7800 Pure hypercholesterolemia, unspecified: Secondary | ICD-10-CM | POA: Diagnosis not present

## 2018-11-11 DIAGNOSIS — Z7689 Persons encountering health services in other specified circumstances: Secondary | ICD-10-CM | POA: Diagnosis not present

## 2018-11-11 DIAGNOSIS — Z6829 Body mass index (BMI) 29.0-29.9, adult: Secondary | ICD-10-CM | POA: Diagnosis not present

## 2018-11-11 DIAGNOSIS — E8940 Asymptomatic postprocedural ovarian failure: Secondary | ICD-10-CM | POA: Diagnosis not present

## 2018-11-11 DIAGNOSIS — Z1339 Encounter for screening examination for other mental health and behavioral disorders: Secondary | ICD-10-CM | POA: Diagnosis not present

## 2018-11-11 DIAGNOSIS — Z1331 Encounter for screening for depression: Secondary | ICD-10-CM | POA: Diagnosis not present

## 2018-11-11 DIAGNOSIS — Z79899 Other long term (current) drug therapy: Secondary | ICD-10-CM | POA: Diagnosis not present

## 2018-11-11 DIAGNOSIS — Z0000 Encounter for general adult medical examination without abnormal findings: Secondary | ICD-10-CM | POA: Diagnosis not present

## 2018-11-11 DIAGNOSIS — Z299 Encounter for prophylactic measures, unspecified: Secondary | ICD-10-CM | POA: Diagnosis not present

## 2018-11-18 DIAGNOSIS — E785 Hyperlipidemia, unspecified: Secondary | ICD-10-CM | POA: Diagnosis not present

## 2018-11-18 DIAGNOSIS — Z6829 Body mass index (BMI) 29.0-29.9, adult: Secondary | ICD-10-CM | POA: Diagnosis not present

## 2018-11-18 DIAGNOSIS — E1165 Type 2 diabetes mellitus with hyperglycemia: Secondary | ICD-10-CM | POA: Diagnosis not present

## 2018-11-18 DIAGNOSIS — Z299 Encounter for prophylactic measures, unspecified: Secondary | ICD-10-CM | POA: Diagnosis not present

## 2018-11-18 DIAGNOSIS — Z789 Other specified health status: Secondary | ICD-10-CM | POA: Diagnosis not present

## 2018-11-21 DIAGNOSIS — G5602 Carpal tunnel syndrome, left upper limb: Secondary | ICD-10-CM | POA: Diagnosis not present

## 2018-11-21 DIAGNOSIS — M9907 Segmental and somatic dysfunction of upper extremity: Secondary | ICD-10-CM | POA: Diagnosis not present

## 2018-11-21 DIAGNOSIS — M9901 Segmental and somatic dysfunction of cervical region: Secondary | ICD-10-CM | POA: Diagnosis not present

## 2018-11-28 DIAGNOSIS — M9907 Segmental and somatic dysfunction of upper extremity: Secondary | ICD-10-CM | POA: Diagnosis not present

## 2018-11-28 DIAGNOSIS — G5602 Carpal tunnel syndrome, left upper limb: Secondary | ICD-10-CM | POA: Diagnosis not present

## 2018-11-28 DIAGNOSIS — M9901 Segmental and somatic dysfunction of cervical region: Secondary | ICD-10-CM | POA: Diagnosis not present

## 2018-12-05 DIAGNOSIS — M9901 Segmental and somatic dysfunction of cervical region: Secondary | ICD-10-CM | POA: Diagnosis not present

## 2018-12-05 DIAGNOSIS — M9907 Segmental and somatic dysfunction of upper extremity: Secondary | ICD-10-CM | POA: Diagnosis not present

## 2018-12-05 DIAGNOSIS — G5602 Carpal tunnel syndrome, left upper limb: Secondary | ICD-10-CM | POA: Diagnosis not present

## 2018-12-15 DIAGNOSIS — E119 Type 2 diabetes mellitus without complications: Secondary | ICD-10-CM | POA: Diagnosis not present

## 2018-12-21 DIAGNOSIS — E785 Hyperlipidemia, unspecified: Secondary | ICD-10-CM | POA: Diagnosis not present

## 2018-12-21 DIAGNOSIS — Z6829 Body mass index (BMI) 29.0-29.9, adult: Secondary | ICD-10-CM | POA: Diagnosis not present

## 2018-12-21 DIAGNOSIS — Z713 Dietary counseling and surveillance: Secondary | ICD-10-CM | POA: Diagnosis not present

## 2018-12-21 DIAGNOSIS — E1165 Type 2 diabetes mellitus with hyperglycemia: Secondary | ICD-10-CM | POA: Diagnosis not present

## 2018-12-21 DIAGNOSIS — Z299 Encounter for prophylactic measures, unspecified: Secondary | ICD-10-CM | POA: Diagnosis not present

## 2019-02-28 DIAGNOSIS — Z90710 Acquired absence of both cervix and uterus: Secondary | ICD-10-CM | POA: Diagnosis not present

## 2019-02-28 DIAGNOSIS — E785 Hyperlipidemia, unspecified: Secondary | ICD-10-CM | POA: Diagnosis not present

## 2019-02-28 DIAGNOSIS — E119 Type 2 diabetes mellitus without complications: Secondary | ICD-10-CM | POA: Diagnosis not present

## 2019-02-28 DIAGNOSIS — Z6828 Body mass index (BMI) 28.0-28.9, adult: Secondary | ICD-10-CM | POA: Diagnosis not present

## 2019-02-28 DIAGNOSIS — Z299 Encounter for prophylactic measures, unspecified: Secondary | ICD-10-CM | POA: Diagnosis not present

## 2019-02-28 DIAGNOSIS — Z789 Other specified health status: Secondary | ICD-10-CM | POA: Diagnosis not present

## 2019-02-28 DIAGNOSIS — E1165 Type 2 diabetes mellitus with hyperglycemia: Secondary | ICD-10-CM | POA: Diagnosis not present

## 2019-03-13 DIAGNOSIS — E8940 Asymptomatic postprocedural ovarian failure: Secondary | ICD-10-CM | POA: Diagnosis not present

## 2019-04-23 DIAGNOSIS — E7800 Pure hypercholesterolemia, unspecified: Secondary | ICD-10-CM | POA: Diagnosis not present

## 2019-04-23 DIAGNOSIS — E785 Hyperlipidemia, unspecified: Secondary | ICD-10-CM | POA: Diagnosis not present

## 2019-06-06 DIAGNOSIS — Z299 Encounter for prophylactic measures, unspecified: Secondary | ICD-10-CM | POA: Diagnosis not present

## 2019-06-06 DIAGNOSIS — E1165 Type 2 diabetes mellitus with hyperglycemia: Secondary | ICD-10-CM | POA: Diagnosis not present

## 2019-07-24 DIAGNOSIS — I10 Essential (primary) hypertension: Secondary | ICD-10-CM | POA: Diagnosis not present

## 2019-07-24 DIAGNOSIS — E785 Hyperlipidemia, unspecified: Secondary | ICD-10-CM | POA: Diagnosis not present

## 2019-09-14 DIAGNOSIS — Z299 Encounter for prophylactic measures, unspecified: Secondary | ICD-10-CM | POA: Diagnosis not present

## 2019-09-14 DIAGNOSIS — E1165 Type 2 diabetes mellitus with hyperglycemia: Secondary | ICD-10-CM | POA: Diagnosis not present

## 2019-09-14 DIAGNOSIS — E7800 Pure hypercholesterolemia, unspecified: Secondary | ICD-10-CM | POA: Diagnosis not present

## 2019-10-19 DIAGNOSIS — Z1231 Encounter for screening mammogram for malignant neoplasm of breast: Secondary | ICD-10-CM | POA: Diagnosis not present

## 2019-10-24 DIAGNOSIS — E785 Hyperlipidemia, unspecified: Secondary | ICD-10-CM | POA: Diagnosis not present

## 2019-10-24 DIAGNOSIS — K219 Gastro-esophageal reflux disease without esophagitis: Secondary | ICD-10-CM | POA: Diagnosis not present

## 2019-10-24 DIAGNOSIS — F329 Major depressive disorder, single episode, unspecified: Secondary | ICD-10-CM | POA: Diagnosis not present

## 2019-11-04 DIAGNOSIS — Z9289 Personal history of other medical treatment: Secondary | ICD-10-CM | POA: Diagnosis not present

## 2019-11-04 DIAGNOSIS — Z20822 Contact with and (suspected) exposure to covid-19: Secondary | ICD-10-CM | POA: Diagnosis not present

## 2019-11-04 DIAGNOSIS — Z9049 Acquired absence of other specified parts of digestive tract: Secondary | ICD-10-CM | POA: Diagnosis not present

## 2019-11-04 DIAGNOSIS — F329 Major depressive disorder, single episode, unspecified: Secondary | ICD-10-CM | POA: Diagnosis not present

## 2019-11-04 DIAGNOSIS — Z91040 Latex allergy status: Secondary | ICD-10-CM | POA: Diagnosis not present

## 2019-11-04 DIAGNOSIS — E785 Hyperlipidemia, unspecified: Secondary | ICD-10-CM | POA: Diagnosis not present

## 2019-11-04 DIAGNOSIS — R079 Chest pain, unspecified: Secondary | ICD-10-CM | POA: Diagnosis not present

## 2019-11-04 DIAGNOSIS — R110 Nausea: Secondary | ICD-10-CM | POA: Diagnosis not present

## 2019-11-04 DIAGNOSIS — R002 Palpitations: Secondary | ICD-10-CM | POA: Diagnosis not present

## 2019-11-04 DIAGNOSIS — Z79899 Other long term (current) drug therapy: Secondary | ICD-10-CM | POA: Diagnosis not present

## 2019-11-04 DIAGNOSIS — K219 Gastro-esophageal reflux disease without esophagitis: Secondary | ICD-10-CM | POA: Diagnosis not present

## 2019-11-04 DIAGNOSIS — R0789 Other chest pain: Secondary | ICD-10-CM | POA: Diagnosis not present

## 2019-11-04 DIAGNOSIS — E119 Type 2 diabetes mellitus without complications: Secondary | ICD-10-CM | POA: Diagnosis not present

## 2019-11-05 DIAGNOSIS — R002 Palpitations: Secondary | ICD-10-CM | POA: Diagnosis not present

## 2019-11-05 DIAGNOSIS — R079 Chest pain, unspecified: Secondary | ICD-10-CM | POA: Diagnosis not present

## 2019-11-08 DIAGNOSIS — R079 Chest pain, unspecified: Secondary | ICD-10-CM | POA: Diagnosis not present

## 2019-11-08 DIAGNOSIS — Z09 Encounter for follow-up examination after completed treatment for conditions other than malignant neoplasm: Secondary | ICD-10-CM | POA: Diagnosis not present

## 2019-11-08 DIAGNOSIS — Z299 Encounter for prophylactic measures, unspecified: Secondary | ICD-10-CM | POA: Diagnosis not present

## 2019-11-08 DIAGNOSIS — E1165 Type 2 diabetes mellitus with hyperglycemia: Secondary | ICD-10-CM | POA: Diagnosis not present

## 2019-11-16 DIAGNOSIS — E119 Type 2 diabetes mellitus without complications: Secondary | ICD-10-CM | POA: Diagnosis not present

## 2019-11-20 DIAGNOSIS — R079 Chest pain, unspecified: Secondary | ICD-10-CM | POA: Diagnosis not present

## 2019-11-22 DIAGNOSIS — E1165 Type 2 diabetes mellitus with hyperglycemia: Secondary | ICD-10-CM | POA: Diagnosis not present

## 2019-11-22 DIAGNOSIS — Z1211 Encounter for screening for malignant neoplasm of colon: Secondary | ICD-10-CM | POA: Diagnosis not present

## 2019-11-22 DIAGNOSIS — Z7189 Other specified counseling: Secondary | ICD-10-CM | POA: Diagnosis not present

## 2019-11-22 DIAGNOSIS — Z6828 Body mass index (BMI) 28.0-28.9, adult: Secondary | ICD-10-CM | POA: Diagnosis not present

## 2019-11-22 DIAGNOSIS — Z0000 Encounter for general adult medical examination without abnormal findings: Secondary | ICD-10-CM | POA: Diagnosis not present

## 2019-11-22 DIAGNOSIS — N390 Urinary tract infection, site not specified: Secondary | ICD-10-CM | POA: Diagnosis not present

## 2019-11-22 DIAGNOSIS — Z1339 Encounter for screening examination for other mental health and behavioral disorders: Secondary | ICD-10-CM | POA: Diagnosis not present

## 2019-11-22 DIAGNOSIS — Z299 Encounter for prophylactic measures, unspecified: Secondary | ICD-10-CM | POA: Diagnosis not present

## 2019-11-22 DIAGNOSIS — E7800 Pure hypercholesterolemia, unspecified: Secondary | ICD-10-CM | POA: Diagnosis not present

## 2019-11-22 DIAGNOSIS — R5383 Other fatigue: Secondary | ICD-10-CM | POA: Diagnosis not present

## 2019-11-22 DIAGNOSIS — Z1331 Encounter for screening for depression: Secondary | ICD-10-CM | POA: Diagnosis not present

## 2019-11-22 DIAGNOSIS — R350 Frequency of micturition: Secondary | ICD-10-CM | POA: Diagnosis not present

## 2019-11-23 DIAGNOSIS — E1165 Type 2 diabetes mellitus with hyperglycemia: Secondary | ICD-10-CM | POA: Diagnosis not present

## 2019-11-23 DIAGNOSIS — E7849 Other hyperlipidemia: Secondary | ICD-10-CM | POA: Diagnosis not present

## 2019-12-21 DIAGNOSIS — E1165 Type 2 diabetes mellitus with hyperglycemia: Secondary | ICD-10-CM | POA: Diagnosis not present

## 2019-12-21 DIAGNOSIS — F322 Major depressive disorder, single episode, severe without psychotic features: Secondary | ICD-10-CM | POA: Diagnosis not present

## 2019-12-21 DIAGNOSIS — E785 Hyperlipidemia, unspecified: Secondary | ICD-10-CM | POA: Diagnosis not present

## 2019-12-21 DIAGNOSIS — Z299 Encounter for prophylactic measures, unspecified: Secondary | ICD-10-CM | POA: Diagnosis not present

## 2019-12-27 DIAGNOSIS — R195 Other fecal abnormalities: Secondary | ICD-10-CM | POA: Diagnosis not present

## 2019-12-27 DIAGNOSIS — K219 Gastro-esophageal reflux disease without esophagitis: Secondary | ICD-10-CM | POA: Diagnosis not present

## 2020-01-02 ENCOUNTER — Encounter: Payer: Self-pay | Admitting: *Deleted

## 2020-01-03 ENCOUNTER — Other Ambulatory Visit: Payer: Self-pay

## 2020-01-03 ENCOUNTER — Ambulatory Visit: Payer: PPO | Admitting: Cardiology

## 2020-01-03 ENCOUNTER — Encounter: Payer: Self-pay | Admitting: Cardiology

## 2020-01-03 ENCOUNTER — Telehealth: Payer: Self-pay | Admitting: Cardiology

## 2020-01-03 VITALS — BP 110/76 | HR 78 | Ht 63.0 in | Wt 166.4 lb

## 2020-01-03 DIAGNOSIS — R002 Palpitations: Secondary | ICD-10-CM

## 2020-01-03 DIAGNOSIS — Z87898 Personal history of other specified conditions: Secondary | ICD-10-CM

## 2020-01-03 DIAGNOSIS — E782 Mixed hyperlipidemia: Secondary | ICD-10-CM | POA: Diagnosis not present

## 2020-01-03 NOTE — Patient Instructions (Addendum)
Medication Instructions:   Your physician recommends that you continue on your current medications as directed. Please refer to the Current Medication list given to you today.  Labwork:  None  Testing/Procedures: ZIO- Long Term Monitor Instructions   Your physician has requested you wear your ZIO patch monitor 7 days.   This is a single patch monitor.  [employer] supplies one patch monitor per enrollment.  Additional stickers are not available.   Please do not apply patch if you will be having a Nuclear Stress Test, Echocardiogram, Cardiac CT, MRI, or Chest Xray during the time frame you would be wearing the monitor. The patch cannot be worn during these tests.  You cannot remove and re-apply the ZIO XT patch monitor.   Your ZIO patch monitor will be sent USPS Priority mail from [facility] directly to your home address. The monitor may also be mailed to a PO BOX if home delivery is not available.   It may take 3-5 days to receive your monitor after you have been enrolled.   Once you have received you monitor, please review enclosed instructions.  Your monitor has already been registered assigning a specific monitor serial # to you.   Applying the monitor   Shave hair from upper left chest.   Hold abrader disc by orange tab.  Rub abrader in 40 strokes over left upper chest as indicated in your monitor instructions.   Clean area with 4 enclosed alcohol pads .  Use all pads to assure are is cleaned thoroughly.  Let dry.   Apply patch as indicated in monitor instructions.  Patch will be place under collarbone on left side of chest with arrow pointing upward.   Rub patch adhesive wings for 2 minutes.Remove white label marked "1".  Remove white label marked "2".  Rub patch adhesive wings for 2 additional minutes.   While looking in a mirror, press and release button in center of patch.  A small green light will flash 3-4 times .  This will be your only indicator the monitor has  been turned on.     Do not shower for the first 24 hours.  You may shower after the first 24 hours.   Press button if you feel a symptom. You will hear a small click.  Record Date, Time and Symptom in the Patient Log Book.   When you are ready to remove patch, follow instructions on last 2 pages of Patient Log Book.  Stick patch monitor onto last page of Patient Log Book.   Place Patient Log Book in Pax box.  Use locking tab on box and tape box closed securely.  The [employer] and [employer] has [employer] on it.  Please place in mailbox as soon as possible.  Your physician should have your test results approximately 7 days after the monitor has been mailed back to [facility].   Call [place] at [phone] if you have questions regarding your ZIO XT patch monitor.  Call them immediately if you see an orange light blinking on your monitor.    If your monitor falls off in less than 4 days contact our Monitor department at [phone].  If your monitor becomes loose or falls off after 4 days call [employer] at [phone] for suggestions on securing your monitor.  Follow-Up:  Your physician recommends that you schedule a follow-up appointment in: pending   Any Other Special Instructions Will Be Listed Below (If Applicable).  If you need a refill  on your cardiac medications before your next appointment, please call your pharmacy.

## 2020-01-03 NOTE — Progress Notes (Signed)
Cardiology Office Note  Date: 01/03/2020   ID: Marilyn Alvarez, DOB 05/02/1950, MRN [id number]  PCP:  Glenda Chroman, MD  Cardiologist:  Rozann Lesches, MD Electrophysiologist:  None   Chief Complaint  Patient presents with   Palpitations    History of Present Illness: Marilyn Alvarez is a 70 y.o. female referred for cardiology consultation by Dr. Woody Seller for the evaluation of palpitations and chest pain.  She tells me that back in September she got her first Moderna COVID-19 vaccine.  The next day she started to feel a sense of increased heart rate, eventually a dull discomfort in her chest.  She was seen at urgent care and referred to [facility] for further assessment and observation.  No definite arrhythmias were described, high-sensitivity troponin I levels were normal, D-dimer normal, and ECG was nonspecific.  She had follow-up with her PCP and eventually had an echocardiogram as noted below.  She got the second vaccine dose and had similar symptoms that lasted for a few days.  At this point she states that she has only a brief sense of palpitations, no definite provocation, no recurring exertional chest pain or syncope.  She states that she has a son with history of WPW.  She has no personal history of cardiac arrhythmias.  Echocardiogram obtained through [facility] in September of this year reported LVEF 65 to 70%, normal RV contraction, mild tricuspid regurgitation, no pericardial effusion.  I personally reviewed her ECG today which shows sinus rhythm with low voltage, R' in lead V1 and V2 possibly normal variant, nonspecific ST changes.  No delta waves.  Past Medical History:  Diagnosis Date   Acid reflux    Depression    History of mononucleosis    History of pneumonia    Hyperlipidemia    Migraine    Peripheral neuropathy    Type 2 diabetes mellitus ([place])    Vertigo     Past Surgical History:  Procedure Laterality Date   ABDOMINAL HYSTERECTOMY      BILATERAL OOPHORECTOMY     CHOLECYSTECTOMY     IUD REMOVAL     SHOULDER SURGERY      Current Outpatient Medications  Medication Sig Dispense Refill   calcium carbonate (OSCAL) 1500 (600 Ca) MG TABS tablet Take 600 mg of elemental calcium by mouth daily with breakfast.     cholecalciferol (VITAMIN D3) 25 MCG (1000 UNIT) tablet Take 1,000 Units by mouth daily.     cyanocobalamin 2000 MCG tablet Take 2,000 mcg by mouth daily.     FLUoxetine (PROZAC) 20 MG capsule Take 20 mg by mouth daily.     Meclizine HCl 25 MG CHEW Chew 1 tablet (25 mg total) by mouth 3 (three) times daily as needed. Reported on 07/02/2015 90 each 6   omeprazole (PRILOSEC) 20 MG capsule Take 20 mg by mouth daily.     promethazine (PHENERGAN) 25 MG tablet Take 1 tablet (25 mg total) by mouth 2 (two) times daily as needed. 30 tablet 6   rosuvastatin (CRESTOR) 10 MG tablet Take 10 mg by mouth at bedtime.     No current facility-administered medications for this visit.   Allergies:  Latex and Thimerosal   Social History: The patient  reports that she has never smoked. She has never used smokeless tobacco. She reports that she does not drink alcohol and does not use drugs.   Family History: The patient's family history includes Diabetes in her brother, daughter, and  mother; Lung cancer in her father.   ROS: No syncope.  Physical Exam: VS:  BP 110/76    Pulse 78    Ht 5\' 3"  (1.6 m)    Wt 166 lb 6.4 oz (75.5 kg)    SpO2 98%    BMI 29.48 kg/m , BMI Body mass index is 29.48 kg/m.  Wt Readings from Last 3 Encounters:  01/03/20 166 lb 6.4 oz (75.5 kg)  12/23/15 162 lb (73.5 kg)  07/18/15 161 lb (73 kg)    General: Patient appears comfortable at rest. HEENT: Conjunctiva and lids normal, wearing a mask. Neck: Supple, no elevated JVP or carotid bruits, no thyromegaly. Lungs: Clear to auscultation, nonlabored breathing at rest. Cardiac: Regular rate and rhythm, no S3 or significant systolic murmur, no  pericardial rub. Abdomen: Soft, bowel sounds present. Extremities: No pitting edema, distal pulses 2+. Skin: Warm and dry. Musculoskeletal: No kyphosis. Neuropsychiatric: Alert and oriented x3, affect grossly appropriate.  ECG:  Unable to review recent tracings.  Recent Labwork:  October 2021: Hemoglobin A1c 6% September 2021: Cholesterol 153, triglycerides 167, HDL 56, LDL 69, hemoglobin 13.7, platelets 296, BUN 12, creatinine 0.88, potassium 4.6, AST 21, ALT 15, TSH 2.79, fecal occult positive  Other Studies Reviewed Today:  Chest x-ray 11/04/2019 [facility]): FINDINGS:  There are calcified granulomas in the left mid lung zone that are  stable from prior study. There is no pneumothorax or focal  infiltrate. The trachea is midline. The heart size is stable. There  is no acute osseous abnormality. There is no large pleural effusion.  No pneumothorax.   IMPRESSION:  No active disease.   Assessment and Plan:  1.  History of palpitations and chest discomfort, most significant symptoms in association with Moderna COVID-19 vaccine, but she is still having brief episodes of palpitations.  No exertional chest pain or syncope.  ECG today shows normal conduction and no delta waves, she states that her son has a history of WPW.  Echocardiogram done in September at [facility] showed normal LVEF at 65 to 70%, no major valvular abnormalities.  We will plan a 7-day Zio patch to exclude any significant arrhythmias, she may be feeling occasional ectopy.  2.  Mixed hyperlipidemia, on Crestor.  Medication Adjustments/Labs and Tests Ordered: Current medicines are reviewed at length with the patient today.  Concerns regarding medicines are outlined above.   Tests Ordered: Orders Placed This Encounter  Procedures   LONG TERM MONITOR (3-14 DAYS)   EKG 12-Lead    Medication Changes: No orders of the defined types were placed in this encounter.   Disposition:  Follow up test  results.  Signed, Satira Sark, MD, [facility] 01/03/2020 9:23 AM    [place] [facility] [place], [place], [place] [address] Phone: [phone]; Fax: [phone]

## 2020-01-03 NOTE — Telephone Encounter (Signed)
Pre-cert Verification for the following procedure     7 Day ZIO XT dx: palpitations   

## 2020-01-04 DIAGNOSIS — Z1159 Encounter for screening for other viral diseases: Secondary | ICD-10-CM | POA: Diagnosis not present

## 2020-01-05 ENCOUNTER — Ambulatory Visit (INDEPENDENT_AMBULATORY_CARE_PROVIDER_SITE_OTHER): Payer: PPO

## 2020-01-05 DIAGNOSIS — R002 Palpitations: Secondary | ICD-10-CM | POA: Diagnosis not present

## 2020-01-08 DIAGNOSIS — K219 Gastro-esophageal reflux disease without esophagitis: Secondary | ICD-10-CM | POA: Diagnosis not present

## 2020-01-08 DIAGNOSIS — K5730 Diverticulosis of large intestine without perforation or abscess without bleeding: Secondary | ICD-10-CM | POA: Diagnosis not present

## 2020-01-08 DIAGNOSIS — K625 Hemorrhage of anus and rectum: Secondary | ICD-10-CM | POA: Diagnosis not present

## 2020-01-08 DIAGNOSIS — R195 Other fecal abnormalities: Secondary | ICD-10-CM | POA: Diagnosis not present

## 2020-01-23 DIAGNOSIS — E785 Hyperlipidemia, unspecified: Secondary | ICD-10-CM | POA: Diagnosis not present

## 2020-01-23 DIAGNOSIS — E7800 Pure hypercholesterolemia, unspecified: Secondary | ICD-10-CM | POA: Diagnosis not present

## 2020-01-23 DIAGNOSIS — R002 Palpitations: Secondary | ICD-10-CM | POA: Diagnosis not present

## 2020-01-25 ENCOUNTER — Telehealth: Payer: Self-pay | Admitting: Cardiology

## 2020-01-25 NOTE — Telephone Encounter (Signed)
Patient informed and verbalized understanding of plan. 

## 2020-01-25 NOTE — Telephone Encounter (Signed)
Patient called requesting results from recent heart monitor.

## 2020-01-25 NOTE — Telephone Encounter (Signed)
-----   Message from Satira Sark, MD sent at 01/24/2020 11:33 AM EST ----- Results reviewed.  Overall cardiac monitor reassuring.  Rhythm is sinus.  She does have brief episodes of PSVT, the longest lasted about 8 seconds.  Importantly, no atrial fibrillation.  The episodes of SVT are not dangerous to her and may not have any specific relation to her COVID-19 vaccine.  If her symptoms continue to improve with time, no specific intervention is required.  Otherwise, we could start her on a low-dose beta-blocker and continue with observation from there.

## 2020-03-25 DIAGNOSIS — E7800 Pure hypercholesterolemia, unspecified: Secondary | ICD-10-CM | POA: Diagnosis not present

## 2020-03-25 DIAGNOSIS — E785 Hyperlipidemia, unspecified: Secondary | ICD-10-CM | POA: Diagnosis not present

## 2020-03-28 DIAGNOSIS — E1165 Type 2 diabetes mellitus with hyperglycemia: Secondary | ICD-10-CM | POA: Diagnosis not present

## 2020-03-28 DIAGNOSIS — Z2821 Immunization not carried out because of patient refusal: Secondary | ICD-10-CM | POA: Diagnosis not present

## 2020-03-28 DIAGNOSIS — Z299 Encounter for prophylactic measures, unspecified: Secondary | ICD-10-CM | POA: Diagnosis not present

## 2020-03-28 DIAGNOSIS — Z789 Other specified health status: Secondary | ICD-10-CM | POA: Diagnosis not present

## 2020-04-22 DIAGNOSIS — E785 Hyperlipidemia, unspecified: Secondary | ICD-10-CM | POA: Diagnosis not present

## 2020-04-22 DIAGNOSIS — E7800 Pure hypercholesterolemia, unspecified: Secondary | ICD-10-CM | POA: Diagnosis not present

## 2020-05-06 DIAGNOSIS — K219 Gastro-esophageal reflux disease without esophagitis: Secondary | ICD-10-CM | POA: Diagnosis not present

## 2020-05-06 DIAGNOSIS — K649 Unspecified hemorrhoids: Secondary | ICD-10-CM | POA: Diagnosis not present

## 2020-05-06 DIAGNOSIS — Z8371 Family history of colonic polyps: Secondary | ICD-10-CM | POA: Diagnosis not present

## 2020-05-06 DIAGNOSIS — K5900 Constipation, unspecified: Secondary | ICD-10-CM | POA: Diagnosis not present

## 2020-05-20 ENCOUNTER — Telehealth: Payer: Self-pay | Admitting: Cardiology

## 2020-05-20 NOTE — Telephone Encounter (Signed)
Patient walked in requesting to get a copy of a recent heart monitor results She is wanting to pick up this afternoon.

## 2020-07-02 DIAGNOSIS — E1165 Type 2 diabetes mellitus with hyperglycemia: Secondary | ICD-10-CM | POA: Diagnosis not present

## 2020-07-02 DIAGNOSIS — Z299 Encounter for prophylactic measures, unspecified: Secondary | ICD-10-CM | POA: Diagnosis not present

## 2020-09-30 DIAGNOSIS — Z299 Encounter for prophylactic measures, unspecified: Secondary | ICD-10-CM | POA: Diagnosis not present

## 2020-09-30 DIAGNOSIS — E1165 Type 2 diabetes mellitus with hyperglycemia: Secondary | ICD-10-CM | POA: Diagnosis not present

## 2020-09-30 DIAGNOSIS — R42 Dizziness and giddiness: Secondary | ICD-10-CM | POA: Diagnosis not present

## 2020-09-30 DIAGNOSIS — J329 Chronic sinusitis, unspecified: Secondary | ICD-10-CM | POA: Diagnosis not present

## 2020-10-18 DIAGNOSIS — E1165 Type 2 diabetes mellitus with hyperglycemia: Secondary | ICD-10-CM | POA: Diagnosis not present

## 2020-10-18 DIAGNOSIS — Z6829 Body mass index (BMI) 29.0-29.9, adult: Secondary | ICD-10-CM | POA: Diagnosis not present

## 2020-10-18 DIAGNOSIS — D485 Neoplasm of uncertain behavior of skin: Secondary | ICD-10-CM | POA: Diagnosis not present

## 2020-10-18 DIAGNOSIS — Z299 Encounter for prophylactic measures, unspecified: Secondary | ICD-10-CM | POA: Diagnosis not present

## 2020-10-18 DIAGNOSIS — Z713 Dietary counseling and surveillance: Secondary | ICD-10-CM | POA: Diagnosis not present

## 2020-11-06 DIAGNOSIS — L814 Other melanin hyperpigmentation: Secondary | ICD-10-CM | POA: Diagnosis not present

## 2020-11-06 DIAGNOSIS — L308 Other specified dermatitis: Secondary | ICD-10-CM | POA: Diagnosis not present

## 2020-11-06 DIAGNOSIS — Z299 Encounter for prophylactic measures, unspecified: Secondary | ICD-10-CM | POA: Diagnosis not present

## 2020-11-06 DIAGNOSIS — D485 Neoplasm of uncertain behavior of skin: Secondary | ICD-10-CM | POA: Diagnosis not present

## 2020-11-21 DIAGNOSIS — Z7189 Other specified counseling: Secondary | ICD-10-CM | POA: Diagnosis not present

## 2020-11-21 DIAGNOSIS — Z1331 Encounter for screening for depression: Secondary | ICD-10-CM | POA: Diagnosis not present

## 2020-11-21 DIAGNOSIS — Z299 Encounter for prophylactic measures, unspecified: Secondary | ICD-10-CM | POA: Diagnosis not present

## 2020-11-21 DIAGNOSIS — Z79899 Other long term (current) drug therapy: Secondary | ICD-10-CM | POA: Diagnosis not present

## 2020-11-21 DIAGNOSIS — Z2821 Immunization not carried out because of patient refusal: Secondary | ICD-10-CM | POA: Diagnosis not present

## 2020-11-21 DIAGNOSIS — Z789 Other specified health status: Secondary | ICD-10-CM | POA: Diagnosis not present

## 2020-11-21 DIAGNOSIS — Z0000 Encounter for general adult medical examination without abnormal findings: Secondary | ICD-10-CM | POA: Diagnosis not present

## 2020-11-21 DIAGNOSIS — Z6829 Body mass index (BMI) 29.0-29.9, adult: Secondary | ICD-10-CM | POA: Diagnosis not present

## 2020-11-21 DIAGNOSIS — E7800 Pure hypercholesterolemia, unspecified: Secondary | ICD-10-CM | POA: Diagnosis not present

## 2020-11-21 DIAGNOSIS — R5383 Other fatigue: Secondary | ICD-10-CM | POA: Diagnosis not present

## 2020-11-21 DIAGNOSIS — Z1339 Encounter for screening examination for other mental health and behavioral disorders: Secondary | ICD-10-CM | POA: Diagnosis not present

## 2020-12-03 ENCOUNTER — Other Ambulatory Visit: Payer: Self-pay

## 2020-12-03 ENCOUNTER — Other Ambulatory Visit: Payer: Self-pay | Admitting: Internal Medicine

## 2020-12-03 ENCOUNTER — Ambulatory Visit
Admission: RE | Admit: 2020-12-03 | Discharge: 2020-12-03 | Disposition: A | Discharge status: Home / Self Care | Payer: PPO | Source: Ambulatory Visit | Attending: Internal Medicine | Admitting: Internal Medicine

## 2020-12-03 DIAGNOSIS — Z1231 Encounter for screening mammogram for malignant neoplasm of breast: Secondary | ICD-10-CM | POA: Diagnosis not present

## 2021-01-15 DIAGNOSIS — J029 Acute pharyngitis, unspecified: Secondary | ICD-10-CM | POA: Diagnosis not present

## 2021-01-15 DIAGNOSIS — Z299 Encounter for prophylactic measures, unspecified: Secondary | ICD-10-CM | POA: Diagnosis not present

## 2021-01-15 DIAGNOSIS — R21 Rash and other nonspecific skin eruption: Secondary | ICD-10-CM | POA: Diagnosis not present

## 2021-01-15 DIAGNOSIS — R42 Dizziness and giddiness: Secondary | ICD-10-CM | POA: Diagnosis not present

## 2021-01-15 DIAGNOSIS — Z6829 Body mass index (BMI) 29.0-29.9, adult: Secondary | ICD-10-CM | POA: Diagnosis not present

## 2021-02-10 ENCOUNTER — Telehealth: Payer: Self-pay | Admitting: Cardiology

## 2021-02-10 DIAGNOSIS — Z6830 Body mass index (BMI) 30.0-30.9, adult: Secondary | ICD-10-CM | POA: Diagnosis not present

## 2021-02-10 DIAGNOSIS — R5383 Other fatigue: Secondary | ICD-10-CM | POA: Diagnosis not present

## 2021-02-10 DIAGNOSIS — Z299 Encounter for prophylactic measures, unspecified: Secondary | ICD-10-CM | POA: Diagnosis not present

## 2021-02-10 DIAGNOSIS — E1165 Type 2 diabetes mellitus with hyperglycemia: Secondary | ICD-10-CM | POA: Diagnosis not present

## 2021-02-10 DIAGNOSIS — I4892 Unspecified atrial flutter: Secondary | ICD-10-CM | POA: Diagnosis not present

## 2021-02-10 NOTE — Telephone Encounter (Signed)
Patient c/o Palpitations:  High priority if patient c/o lightheadedness, shortness of breath, or chest pain  How long have you had palpitations/irregular HR/ Afib? Are you having the symptoms now? Yes   Are you currently experiencing lightheadedness, SOB or CP? SOB, CP  Do you have a history of afib (atrial fibrillation) or irregular heart rhythm? No  Have you checked your BP or HR? (document readings if available): 143/91; 101  Are you experiencing any other symptoms? Nausea yesterday

## 2021-02-10 NOTE — Telephone Encounter (Signed)
Reports elevated HR 106 & BP 148/92 today. Reports palpitations and a little SOB. Denies chest pain. Denies cough, congestion, fever, n/v. Reports 2 deaths in family recently that caused anxiety. Admits to drinking 1/2 caffeine coffee- 2 cups per day. Gave first available appointment 04/10/21 with Domenic Polite and placed on wait list. Advised to contact PCP for a sooner appointment. Verbalized understanding of plan.

## 2021-02-10 NOTE — Telephone Encounter (Signed)
Patient informed and verbalized understanding of plan. 

## 2021-02-12 ENCOUNTER — Encounter: Payer: Self-pay | Admitting: Cardiology

## 2021-02-12 DIAGNOSIS — R002 Palpitations: Secondary | ICD-10-CM | POA: Diagnosis not present

## 2021-03-03 DIAGNOSIS — R002 Palpitations: Secondary | ICD-10-CM | POA: Diagnosis not present

## 2021-03-13 DIAGNOSIS — R002 Palpitations: Secondary | ICD-10-CM | POA: Diagnosis not present

## 2021-04-09 NOTE — Progress Notes (Signed)
Cardiology Office Note  Date: 04/10/2021   ID: SOLARIS KRAM, DOB 1950-05-22, MRN [id number]  PCP:  Glenda Chroman, MD  Cardiologist:  Rozann Lesches, MD Electrophysiologist:  None   Chief Complaint  Patient presents with   Cardiac follow-up    History of Present Illness: Marilyn Alvarez is a 71 y.o. female last seen in November 2021.  She is here for a follow-up visit.  She tells me that in December 2022 she started experiencing more frequent palpitations, both a rapid heart rate and a skipping sensation.  No associated chest pain or syncope, but felt very anxious with the symptoms and saw her PCP.  She was started on atenolol and states that her symptoms dramatically improved and are absent at this time.  She also wore a Zio patch, I do not have the results for review.  In the past cardiac monitor showed rare atrial and ventricular ectopy and also a brief burst of SVT.  She is tolerating the atenolol well.  I personally reviewed her ECG today which shows sinus rhythm with low voltage, decreased R wave progression.  Past Medical History:  Diagnosis Date   Acid reflux    Depression    History of mononucleosis    History of pneumonia    Hyperlipidemia    Migraine    Peripheral neuropathy    Type 2 diabetes mellitus ([place])    Vertigo     Past Surgical History:  Procedure Laterality Date   ABDOMINAL HYSTERECTOMY     BILATERAL OOPHORECTOMY     BREAST BIOPSY Right    pt unsure when/where   CHOLECYSTECTOMY     IUD REMOVAL     SHOULDER SURGERY      Current Outpatient Medications  Medication Sig Dispense Refill   acetaminophen (TYLENOL) 325 MG tablet Take 650 mg by mouth every 6 (six) hours as needed.     atenolol (TENORMIN) 25 MG tablet Take 25 mg by mouth daily.     calcium carbonate (OSCAL) 1500 (600 Ca) MG TABS tablet Take 600 mg of elemental calcium by mouth daily with breakfast.     cholecalciferol (VITAMIN D3) 25 MCG (1000 UNIT) tablet Take 1,000 Units by mouth daily.      cyanocobalamin 2000 MCG tablet Take 2,000 mcg by mouth daily.     FLUoxetine (PROZAC) 20 MG capsule Take 20 mg by mouth daily.     meclizine (ANTIVERT) 25 MG tablet Take 25 mg by mouth 3 (three) times daily as needed for dizziness.     omeprazole (PRILOSEC) 20 MG capsule Take 20 mg by mouth daily.     promethazine (PHENERGAN) 25 MG tablet Take 1 tablet (25 mg total) by mouth 2 (two) times daily as needed. 30 tablet 6   rosuvastatin (CRESTOR) 10 MG tablet Take 10 mg by mouth at bedtime.     No current facility-administered medications for this visit.   Allergies:  Latex and Thimerosal   ROS: No syncope.  Physical Exam: VS:  BP 116/80    Pulse 64    Ht 5\' 3"  (1.6 m)    Wt 172 lb (78 kg)    SpO2 97%    BMI 30.47 kg/m , BMI Body mass index is 30.47 kg/m.  Wt Readings from Last 3 Encounters:  04/10/21 172 lb (78 kg)  01/03/20 166 lb 6.4 oz (75.5 kg)  12/23/15 162 lb (73.5 kg)    General: Patient appears comfortable at rest. HEENT: Conjunctiva and lids normal,  wearing a mask. Neck: Supple, no elevated JVP or carotid bruits, no thyromegaly. Lungs: Clear to auscultation, nonlabored breathing at rest. Cardiac: Regular rate and rhythm, no S3 or significant systolic murmur, no pericardial rub.  ECG:  An ECG dated 01/03/2020 was personally reviewed today and demonstrated:  Sinus rhythm with R' in lead V1 and V2 , nonspecific T wave changes.  Recent Labwork:  October 2021: Hemoglobin A1c 6%, cholesterol 153, triglycerides 167, HDL 56, LDL 69, hemoglobin 13.7, platelets 296, BUN 12, creatinine 0.88, potassium 4.6, AST 21, ALT 15, TSH 2.79  Other Studies Reviewed Today:  Echocardiogram 11/20/2019 [facility]): Normal LV wall thickness and chamber size with LVEF 65 to 70%, mild tricuspid regurgitation, no pericardial effusion.  Cardiac monitor December 2021: ZIO XT reviewed.  7 days 4 hours analyzed.  Predominant rhythm is sinus with heart rate ranging from 49 bpm up to 131 bpm  and average heart rate 72 bpm.  Rare PACs including couplets and triplets were noted representing less than 1% total beats.  There were also rare PVCs representing less than 1% total beats.  Brief episodes of SVT were noted, the longest lasted approximately 8 seconds and heart rate in the 160s.  There were no sustained arrhythmias or pauses.  Assessment and Plan:  1.  Palpitations with exacerbation in December 2022, dramatically improved on atenolol.  Possibly experiencing episodes of SVT or increasing atrial and ventricular ectopy.  For now would continue atenolol, requesting Zio patch from [facility] internal medicine for review.  We will reestablish more regular follow-up.  2.  Mixed hyperlipidemia, she continues on Crestor.  Medication Adjustments/Labs and Tests Ordered: Current medicines are reviewed at length with the patient today.  Concerns regarding medicines are outlined above.   Tests Ordered: Orders Placed This Encounter  Procedures   EKG 12-Lead    Medication Changes: No orders of the defined types were placed in this encounter.   Disposition:  Follow up  6 months.  Signed, Satira Sark, MD, [facility] 04/10/2021 11:48 AM    [place] at [place], [place], [place] [address] Phone: [phone]; Fax: [phone]

## 2021-04-10 ENCOUNTER — Ambulatory Visit: Payer: PPO | Admitting: Cardiology

## 2021-04-10 ENCOUNTER — Encounter: Payer: Self-pay | Admitting: *Deleted

## 2021-04-10 ENCOUNTER — Encounter: Payer: Self-pay | Admitting: Cardiology

## 2021-04-10 VITALS — BP 116/80 | HR 64 | Ht 63.0 in | Wt 172.0 lb

## 2021-04-10 DIAGNOSIS — R002 Palpitations: Secondary | ICD-10-CM

## 2021-04-10 DIAGNOSIS — E782 Mixed hyperlipidemia: Secondary | ICD-10-CM | POA: Diagnosis not present

## 2021-04-10 NOTE — Patient Instructions (Addendum)

## 2021-04-15 ENCOUNTER — Telehealth: Payer: Self-pay | Admitting: *Deleted

## 2021-04-15 NOTE — Telephone Encounter (Signed)
Patient informed and verbalized understanding of plan. 

## 2021-04-15 NOTE — Telephone Encounter (Signed)
-----   Message from Satira Sark, MD sent at 04/13/2021  5:16 PM EST ----- Results reviewed.  Zio patch from [facility] reviewed.  She did have episodes of brief SVT, agree with continuing atenolol which was discussed at the recent office visit.

## 2021-05-13 DIAGNOSIS — Z6830 Body mass index (BMI) 30.0-30.9, adult: Secondary | ICD-10-CM | POA: Diagnosis not present

## 2021-05-13 DIAGNOSIS — E1165 Type 2 diabetes mellitus with hyperglycemia: Secondary | ICD-10-CM | POA: Diagnosis not present

## 2021-05-13 DIAGNOSIS — Z299 Encounter for prophylactic measures, unspecified: Secondary | ICD-10-CM | POA: Diagnosis not present

## 2021-05-13 DIAGNOSIS — Z789 Other specified health status: Secondary | ICD-10-CM | POA: Diagnosis not present

## 2021-05-13 DIAGNOSIS — M549 Dorsalgia, unspecified: Secondary | ICD-10-CM | POA: Diagnosis not present

## 2021-05-13 DIAGNOSIS — H6690 Otitis media, unspecified, unspecified ear: Secondary | ICD-10-CM | POA: Diagnosis not present

## 2021-05-26 DIAGNOSIS — D71 Functional disorders of polymorphonuclear neutrophils: Secondary | ICD-10-CM | POA: Diagnosis not present

## 2021-05-26 DIAGNOSIS — J40 Bronchitis, not specified as acute or chronic: Secondary | ICD-10-CM | POA: Diagnosis not present

## 2021-05-26 DIAGNOSIS — R059 Cough, unspecified: Secondary | ICD-10-CM | POA: Diagnosis not present

## 2021-05-26 DIAGNOSIS — R0989 Other specified symptoms and signs involving the circulatory and respiratory systems: Secondary | ICD-10-CM | POA: Diagnosis not present

## 2021-05-26 DIAGNOSIS — J8410 Pulmonary fibrosis, unspecified: Secondary | ICD-10-CM | POA: Diagnosis not present

## 2021-05-26 DIAGNOSIS — Z9049 Acquired absence of other specified parts of digestive tract: Secondary | ICD-10-CM | POA: Diagnosis not present

## 2021-05-26 DIAGNOSIS — Z299 Encounter for prophylactic measures, unspecified: Secondary | ICD-10-CM | POA: Diagnosis not present

## 2021-05-26 DIAGNOSIS — I700 Atherosclerosis of aorta: Secondary | ICD-10-CM | POA: Diagnosis not present

## 2021-05-26 DIAGNOSIS — Z789 Other specified health status: Secondary | ICD-10-CM | POA: Diagnosis not present

## 2021-05-26 DIAGNOSIS — E1165 Type 2 diabetes mellitus with hyperglycemia: Secondary | ICD-10-CM | POA: Diagnosis not present

## 2021-07-04 DIAGNOSIS — Z713 Dietary counseling and surveillance: Secondary | ICD-10-CM | POA: Diagnosis not present

## 2021-07-04 DIAGNOSIS — N390 Urinary tract infection, site not specified: Secondary | ICD-10-CM | POA: Diagnosis not present

## 2021-07-04 DIAGNOSIS — Z299 Encounter for prophylactic measures, unspecified: Secondary | ICD-10-CM | POA: Diagnosis not present

## 2021-07-04 DIAGNOSIS — Z6829 Body mass index (BMI) 29.0-29.9, adult: Secondary | ICD-10-CM | POA: Diagnosis not present

## 2021-07-07 DIAGNOSIS — N390 Urinary tract infection, site not specified: Secondary | ICD-10-CM | POA: Diagnosis not present

## 2021-07-07 DIAGNOSIS — R1012 Left upper quadrant pain: Secondary | ICD-10-CM | POA: Diagnosis not present

## 2021-07-07 DIAGNOSIS — N23 Unspecified renal colic: Secondary | ICD-10-CM | POA: Diagnosis not present

## 2021-07-07 DIAGNOSIS — I700 Atherosclerosis of aorta: Secondary | ICD-10-CM | POA: Diagnosis not present

## 2021-07-07 DIAGNOSIS — R109 Unspecified abdominal pain: Secondary | ICD-10-CM | POA: Diagnosis not present

## 2021-07-07 DIAGNOSIS — Z299 Encounter for prophylactic measures, unspecified: Secondary | ICD-10-CM | POA: Diagnosis not present

## 2021-07-08 DIAGNOSIS — K5790 Diverticulosis of intestine, part unspecified, without perforation or abscess without bleeding: Secondary | ICD-10-CM | POA: Diagnosis not present

## 2021-07-08 DIAGNOSIS — R110 Nausea: Secondary | ICD-10-CM | POA: Diagnosis not present

## 2021-07-08 DIAGNOSIS — Z299 Encounter for prophylactic measures, unspecified: Secondary | ICD-10-CM | POA: Diagnosis not present

## 2021-07-08 DIAGNOSIS — M5136 Other intervertebral disc degeneration, lumbar region: Secondary | ICD-10-CM | POA: Diagnosis not present

## 2021-07-08 DIAGNOSIS — I700 Atherosclerosis of aorta: Secondary | ICD-10-CM | POA: Diagnosis not present

## 2021-07-08 DIAGNOSIS — Z6829 Body mass index (BMI) 29.0-29.9, adult: Secondary | ICD-10-CM | POA: Diagnosis not present

## 2021-07-09 DIAGNOSIS — M9902 Segmental and somatic dysfunction of thoracic region: Secondary | ICD-10-CM | POA: Diagnosis not present

## 2021-07-09 DIAGNOSIS — S233XXA Sprain of ligaments of thoracic spine, initial encounter: Secondary | ICD-10-CM | POA: Diagnosis not present

## 2021-07-09 DIAGNOSIS — M9903 Segmental and somatic dysfunction of lumbar region: Secondary | ICD-10-CM | POA: Diagnosis not present

## 2021-07-09 DIAGNOSIS — S338XXA Sprain of other parts of lumbar spine and pelvis, initial encounter: Secondary | ICD-10-CM | POA: Diagnosis not present

## 2021-07-14 DIAGNOSIS — M9902 Segmental and somatic dysfunction of thoracic region: Secondary | ICD-10-CM | POA: Diagnosis not present

## 2021-07-14 DIAGNOSIS — Z789 Other specified health status: Secondary | ICD-10-CM | POA: Diagnosis not present

## 2021-07-14 DIAGNOSIS — N390 Urinary tract infection, site not specified: Secondary | ICD-10-CM | POA: Diagnosis not present

## 2021-07-14 DIAGNOSIS — M9903 Segmental and somatic dysfunction of lumbar region: Secondary | ICD-10-CM | POA: Diagnosis not present

## 2021-07-14 DIAGNOSIS — M5440 Lumbago with sciatica, unspecified side: Secondary | ICD-10-CM | POA: Diagnosis not present

## 2021-07-14 DIAGNOSIS — E1165 Type 2 diabetes mellitus with hyperglycemia: Secondary | ICD-10-CM | POA: Diagnosis not present

## 2021-07-14 DIAGNOSIS — S338XXA Sprain of other parts of lumbar spine and pelvis, initial encounter: Secondary | ICD-10-CM | POA: Diagnosis not present

## 2021-07-14 DIAGNOSIS — S233XXA Sprain of ligaments of thoracic spine, initial encounter: Secondary | ICD-10-CM | POA: Diagnosis not present

## 2021-07-14 DIAGNOSIS — Z299 Encounter for prophylactic measures, unspecified: Secondary | ICD-10-CM | POA: Diagnosis not present

## 2021-07-15 DIAGNOSIS — M549 Dorsalgia, unspecified: Secondary | ICD-10-CM | POA: Diagnosis not present

## 2021-07-15 DIAGNOSIS — M545 Low back pain, unspecified: Secondary | ICD-10-CM | POA: Diagnosis not present

## 2021-07-15 DIAGNOSIS — M546 Pain in thoracic spine: Secondary | ICD-10-CM | POA: Diagnosis not present

## 2021-07-15 DIAGNOSIS — M47816 Spondylosis without myelopathy or radiculopathy, lumbar region: Secondary | ICD-10-CM | POA: Diagnosis not present

## 2021-07-15 DIAGNOSIS — M47814 Spondylosis without myelopathy or radiculopathy, thoracic region: Secondary | ICD-10-CM | POA: Diagnosis not present

## 2021-07-15 DIAGNOSIS — M2569 Stiffness of other specified joint, not elsewhere classified: Secondary | ICD-10-CM | POA: Diagnosis not present

## 2021-07-15 DIAGNOSIS — R531 Weakness: Secondary | ICD-10-CM | POA: Diagnosis not present

## 2021-07-16 DIAGNOSIS — M2569 Stiffness of other specified joint, not elsewhere classified: Secondary | ICD-10-CM | POA: Diagnosis not present

## 2021-07-16 DIAGNOSIS — R531 Weakness: Secondary | ICD-10-CM | POA: Diagnosis not present

## 2021-07-16 DIAGNOSIS — M546 Pain in thoracic spine: Secondary | ICD-10-CM | POA: Diagnosis not present

## 2021-07-16 DIAGNOSIS — M545 Low back pain, unspecified: Secondary | ICD-10-CM | POA: Diagnosis not present

## 2021-07-22 DIAGNOSIS — M9902 Segmental and somatic dysfunction of thoracic region: Secondary | ICD-10-CM | POA: Diagnosis not present

## 2021-07-22 DIAGNOSIS — S233XXA Sprain of ligaments of thoracic spine, initial encounter: Secondary | ICD-10-CM | POA: Diagnosis not present

## 2021-07-22 DIAGNOSIS — M9903 Segmental and somatic dysfunction of lumbar region: Secondary | ICD-10-CM | POA: Diagnosis not present

## 2021-07-22 DIAGNOSIS — S338XXA Sprain of other parts of lumbar spine and pelvis, initial encounter: Secondary | ICD-10-CM | POA: Diagnosis not present

## 2021-07-29 DIAGNOSIS — M9902 Segmental and somatic dysfunction of thoracic region: Secondary | ICD-10-CM | POA: Diagnosis not present

## 2021-07-29 DIAGNOSIS — S338XXA Sprain of other parts of lumbar spine and pelvis, initial encounter: Secondary | ICD-10-CM | POA: Diagnosis not present

## 2021-07-29 DIAGNOSIS — M9903 Segmental and somatic dysfunction of lumbar region: Secondary | ICD-10-CM | POA: Diagnosis not present

## 2021-07-29 DIAGNOSIS — S233XXA Sprain of ligaments of thoracic spine, initial encounter: Secondary | ICD-10-CM | POA: Diagnosis not present

## 2021-08-08 DIAGNOSIS — R5383 Other fatigue: Secondary | ICD-10-CM | POA: Diagnosis not present

## 2021-08-08 DIAGNOSIS — N390 Urinary tract infection, site not specified: Secondary | ICD-10-CM | POA: Diagnosis not present

## 2021-08-08 DIAGNOSIS — Z299 Encounter for prophylactic measures, unspecified: Secondary | ICD-10-CM | POA: Diagnosis not present

## 2021-08-12 DIAGNOSIS — S338XXA Sprain of other parts of lumbar spine and pelvis, initial encounter: Secondary | ICD-10-CM | POA: Diagnosis not present

## 2021-08-12 DIAGNOSIS — M9903 Segmental and somatic dysfunction of lumbar region: Secondary | ICD-10-CM | POA: Diagnosis not present

## 2021-08-12 DIAGNOSIS — S233XXA Sprain of ligaments of thoracic spine, initial encounter: Secondary | ICD-10-CM | POA: Diagnosis not present

## 2021-08-12 DIAGNOSIS — M9902 Segmental and somatic dysfunction of thoracic region: Secondary | ICD-10-CM | POA: Diagnosis not present

## 2021-08-28 DIAGNOSIS — Z6830 Body mass index (BMI) 30.0-30.9, adult: Secondary | ICD-10-CM | POA: Diagnosis not present

## 2021-08-28 DIAGNOSIS — F339 Major depressive disorder, recurrent, unspecified: Secondary | ICD-10-CM | POA: Diagnosis not present

## 2021-08-28 DIAGNOSIS — Z299 Encounter for prophylactic measures, unspecified: Secondary | ICD-10-CM | POA: Diagnosis not present

## 2021-08-28 DIAGNOSIS — E1165 Type 2 diabetes mellitus with hyperglycemia: Secondary | ICD-10-CM | POA: Diagnosis not present

## 2021-08-28 DIAGNOSIS — I4892 Unspecified atrial flutter: Secondary | ICD-10-CM | POA: Diagnosis not present

## 2021-08-28 DIAGNOSIS — E7800 Pure hypercholesterolemia, unspecified: Secondary | ICD-10-CM | POA: Diagnosis not present

## 2021-09-08 DIAGNOSIS — Z299 Encounter for prophylactic measures, unspecified: Secondary | ICD-10-CM | POA: Diagnosis not present

## 2021-09-08 DIAGNOSIS — J029 Acute pharyngitis, unspecified: Secondary | ICD-10-CM | POA: Diagnosis not present

## 2021-09-08 DIAGNOSIS — R059 Cough, unspecified: Secondary | ICD-10-CM | POA: Diagnosis not present

## 2021-09-08 DIAGNOSIS — J069 Acute upper respiratory infection, unspecified: Secondary | ICD-10-CM | POA: Diagnosis not present

## 2021-09-08 DIAGNOSIS — H6692 Otitis media, unspecified, left ear: Secondary | ICD-10-CM | POA: Diagnosis not present

## 2021-09-08 DIAGNOSIS — B370 Candidal stomatitis: Secondary | ICD-10-CM | POA: Diagnosis not present

## 2021-09-09 DIAGNOSIS — J069 Acute upper respiratory infection, unspecified: Secondary | ICD-10-CM | POA: Diagnosis not present

## 2021-09-09 DIAGNOSIS — Z6830 Body mass index (BMI) 30.0-30.9, adult: Secondary | ICD-10-CM | POA: Diagnosis not present

## 2021-09-09 DIAGNOSIS — Z299 Encounter for prophylactic measures, unspecified: Secondary | ICD-10-CM | POA: Diagnosis not present

## 2021-09-10 DIAGNOSIS — Z299 Encounter for prophylactic measures, unspecified: Secondary | ICD-10-CM | POA: Diagnosis not present

## 2021-09-10 DIAGNOSIS — R059 Cough, unspecified: Secondary | ICD-10-CM | POA: Diagnosis not present

## 2021-09-10 DIAGNOSIS — J302 Other seasonal allergic rhinitis: Secondary | ICD-10-CM | POA: Diagnosis not present

## 2021-09-10 DIAGNOSIS — J069 Acute upper respiratory infection, unspecified: Secondary | ICD-10-CM | POA: Diagnosis not present

## 2021-09-29 DIAGNOSIS — E119 Type 2 diabetes mellitus without complications: Secondary | ICD-10-CM | POA: Diagnosis not present

## 2021-10-16 ENCOUNTER — Ambulatory Visit: Payer: PPO | Admitting: Cardiology

## 2021-10-16 ENCOUNTER — Encounter: Payer: Self-pay | Admitting: Cardiology

## 2021-10-16 VITALS — BP 118/72 | HR 74 | Ht 63.0 in | Wt 170.8 lb

## 2021-10-16 DIAGNOSIS — I471 Supraventricular tachycardia: Secondary | ICD-10-CM | POA: Diagnosis not present

## 2021-10-16 DIAGNOSIS — E782 Mixed hyperlipidemia: Secondary | ICD-10-CM | POA: Diagnosis not present

## 2021-10-16 NOTE — Progress Notes (Signed)
Cardiology Office Note  Date: 10/16/2021   ID: Devony, Mcgrady [birth year], MRN [id number]  PCP:  Glenda Chroman, MD  Cardiologist:  Rozann Lesches, MD Electrophysiologist:  None   Chief Complaint  Patient presents with   Cardiac follow-up    History of Present Illness: Marilyn Alvarez is a 71 y.o. female last seen in February.  She is here for a follow-up visit.  States that she has found out that if she hydrates better her symptoms of palpitations are much less.  I did review the Zio patch obtained through [facility] internal medicine as discussed at the last visit, brief episodes of SVT were noted and atenolol was continued.  No other major health changes, although she has had some dental problems that are being worked up at this time.  Past Medical History:  Diagnosis Date   Acid reflux    Depression    History of mononucleosis    History of pneumonia    Hyperlipidemia    Migraine    Peripheral neuropathy    Type 2 diabetes mellitus ([place])    Vertigo     Past Surgical History:  Procedure Laterality Date   ABDOMINAL HYSTERECTOMY     BILATERAL OOPHORECTOMY     BREAST BIOPSY Right    pt unsure when/where   CHOLECYSTECTOMY     IUD REMOVAL     SHOULDER SURGERY      Current Outpatient Medications  Medication Sig Dispense Refill   acetaminophen (TYLENOL) 325 MG tablet Take 650 mg by mouth every 6 (six) hours as needed.     atenolol (TENORMIN) 25 MG tablet Take 25 mg by mouth daily.     calcium carbonate (OSCAL) 1500 (600 Ca) MG TABS tablet Take 600 mg of elemental calcium by mouth daily with breakfast.     cholecalciferol (VITAMIN D3) 25 MCG (1000 UNIT) tablet Take 1,000 Units by mouth daily.     cyanocobalamin 2000 MCG tablet Take 2,000 mcg by mouth daily.     FLUoxetine (PROZAC) 20 MG capsule Take 20 mg by mouth daily.     meclizine (ANTIVERT) 25 MG tablet Take 25 mg by mouth 3 (three) times daily as needed for dizziness.     omeprazole (PRILOSEC) 20 MG capsule Take  20 mg by mouth daily.     OZEMPIC, 0.25 OR 0.5 MG/DOSE, 2 MG/3ML SOPN Inject 0.5 mg into the skin once a week.     promethazine (PHENERGAN) 25 MG tablet Take 1 tablet (25 mg total) by mouth 2 (two) times daily as needed. 30 tablet 6   rosuvastatin (CRESTOR) 10 MG tablet Take 10 mg by mouth at bedtime.     No current facility-administered medications for this visit.   Allergies:  Latex and Thimerosal   ROS:  No syncope. Vertigo.  Physical Exam: VS:  BP 118/72   Pulse 74   Ht '5\' 3"'$  (1.6 m)   Wt 170 lb 12.8 oz (77.5 kg)   SpO2 97%   BMI 30.26 kg/m , BMI Body mass index is 30.26 kg/m.  Wt Readings from Last 3 Encounters:  10/16/21 170 lb 12.8 oz (77.5 kg)  04/10/21 172 lb (78 kg)  01/03/20 166 lb 6.4 oz (75.5 kg)    General: Patient appears comfortable at rest. HEENT: Conjunctiva and lids normal. Lungs: Clear to auscultation, nonlabored breathing at rest. Cardiac: Regular rate and rhythm, no S3 or significant systolic murmur, no pericardial rub.  ECG:  An ECG dated 04/10/2021 was  personally reviewed today and demonstrated:  Sinus rhythm with low voltage, decreased R wave progression.  Recent Labwork:  October 2021: Hemoglobin A1c 6%, cholesterol 153, triglycerides 167, HDL 56, LDL 69, hemoglobin 13.7, platelets 296, BUN 12, creatinine 0.88, potassium 4.6, AST 21, ALT 15, TSH 2.79  Other Studies Reviewed Today:  Echocardiogram 11/20/2019 [facility]): Normal LV wall thickness and chamber size with LVEF 65 to 70%, mild tricuspid regurgitation, no pericardial effusion.  Assessment and Plan:  1.  Intermittent palpitations with history of SVT and ectopy by cardiac monitor.  States that symptoms are much better if she hydrates adequately, for now continue atenolol, although this might be able to be weaned off at some point.  No further cardiac testing for now.  2.  Mixed hyperlipidemia, remains on Crestor.  Medication Adjustments/Labs and Tests Ordered: Current  medicines are reviewed at length with the patient today.  Concerns regarding medicines are outlined above.   Tests Ordered: No orders of the defined types were placed in this encounter.   Medication Changes: No orders of the defined types were placed in this encounter.   Disposition:  Follow up  1 year.  Signed, Satira Sark, MD, [facility] 10/16/2021 11:18 AM    [place] at [place], [place], [place] [address] Phone: [phone]; Fax: [phone]

## 2021-10-16 NOTE — Patient Instructions (Signed)
Medication Instructions:  Your physician recommends that you continue on your current medications as directed. Please refer to the Current Medication list given to you today.   Labwork: none  Testing/Procedures: none  Follow-Up:  Your physician recommends that you schedule a follow-up appointment in: 1 year   Any Other Special Instructions Will Be Listed Below (If Applicable).  You will receive a call in about 10 months reminding you to schedule your appointment. If you do not receive this call, please contact our office.  If you need a refill on your cardiac medications before your next appointment, please call your pharmacy.  

## 2021-12-02 DIAGNOSIS — E1165 Type 2 diabetes mellitus with hyperglycemia: Secondary | ICD-10-CM | POA: Diagnosis not present

## 2021-12-02 DIAGNOSIS — I700 Atherosclerosis of aorta: Secondary | ICD-10-CM | POA: Diagnosis not present

## 2021-12-02 DIAGNOSIS — Z0000 Encounter for general adult medical examination without abnormal findings: Secondary | ICD-10-CM | POA: Diagnosis not present

## 2021-12-02 DIAGNOSIS — F339 Major depressive disorder, recurrent, unspecified: Secondary | ICD-10-CM | POA: Diagnosis not present

## 2021-12-02 DIAGNOSIS — R5383 Other fatigue: Secondary | ICD-10-CM | POA: Diagnosis not present

## 2021-12-02 DIAGNOSIS — Z79899 Other long term (current) drug therapy: Secondary | ICD-10-CM | POA: Diagnosis not present

## 2021-12-02 DIAGNOSIS — Z1331 Encounter for screening for depression: Secondary | ICD-10-CM | POA: Diagnosis not present

## 2021-12-02 DIAGNOSIS — Z1339 Encounter for screening examination for other mental health and behavioral disorders: Secondary | ICD-10-CM | POA: Diagnosis not present

## 2021-12-02 DIAGNOSIS — Z299 Encounter for prophylactic measures, unspecified: Secondary | ICD-10-CM | POA: Diagnosis not present

## 2021-12-02 DIAGNOSIS — Z7189 Other specified counseling: Secondary | ICD-10-CM | POA: Diagnosis not present

## 2021-12-02 DIAGNOSIS — E7800 Pure hypercholesterolemia, unspecified: Secondary | ICD-10-CM | POA: Diagnosis not present

## 2021-12-02 DIAGNOSIS — Z6830 Body mass index (BMI) 30.0-30.9, adult: Secondary | ICD-10-CM | POA: Diagnosis not present

## 2021-12-22 DIAGNOSIS — R42 Dizziness and giddiness: Secondary | ICD-10-CM | POA: Diagnosis not present

## 2021-12-22 DIAGNOSIS — R2681 Unsteadiness on feet: Secondary | ICD-10-CM | POA: Diagnosis not present

## 2021-12-24 DIAGNOSIS — R2681 Unsteadiness on feet: Secondary | ICD-10-CM | POA: Diagnosis not present

## 2021-12-24 DIAGNOSIS — R42 Dizziness and giddiness: Secondary | ICD-10-CM | POA: Diagnosis not present

## 2021-12-30 DIAGNOSIS — E2839 Other primary ovarian failure: Secondary | ICD-10-CM | POA: Diagnosis not present

## 2021-12-31 DIAGNOSIS — R2681 Unsteadiness on feet: Secondary | ICD-10-CM | POA: Diagnosis not present

## 2021-12-31 DIAGNOSIS — R42 Dizziness and giddiness: Secondary | ICD-10-CM | POA: Diagnosis not present

## 2022-01-05 DIAGNOSIS — R42 Dizziness and giddiness: Secondary | ICD-10-CM | POA: Diagnosis not present

## 2022-01-05 DIAGNOSIS — R2681 Unsteadiness on feet: Secondary | ICD-10-CM | POA: Diagnosis not present

## 2022-01-07 DIAGNOSIS — R42 Dizziness and giddiness: Secondary | ICD-10-CM | POA: Diagnosis not present

## 2022-01-07 DIAGNOSIS — R2681 Unsteadiness on feet: Secondary | ICD-10-CM | POA: Diagnosis not present

## 2022-01-12 DIAGNOSIS — D485 Neoplasm of uncertain behavior of skin: Secondary | ICD-10-CM | POA: Diagnosis not present

## 2022-01-12 DIAGNOSIS — R42 Dizziness and giddiness: Secondary | ICD-10-CM | POA: Diagnosis not present

## 2022-01-12 DIAGNOSIS — X32XXXA Exposure to sunlight, initial encounter: Secondary | ICD-10-CM | POA: Diagnosis not present

## 2022-01-12 DIAGNOSIS — R2681 Unsteadiness on feet: Secondary | ICD-10-CM | POA: Diagnosis not present

## 2022-01-12 DIAGNOSIS — D225 Melanocytic nevi of trunk: Secondary | ICD-10-CM | POA: Diagnosis not present

## 2022-01-12 DIAGNOSIS — L570 Actinic keratosis: Secondary | ICD-10-CM | POA: Diagnosis not present

## 2022-01-19 ENCOUNTER — Other Ambulatory Visit: Payer: Self-pay | Admitting: Internal Medicine

## 2022-01-19 DIAGNOSIS — R42 Dizziness and giddiness: Secondary | ICD-10-CM | POA: Diagnosis not present

## 2022-01-19 DIAGNOSIS — Z1231 Encounter for screening mammogram for malignant neoplasm of breast: Secondary | ICD-10-CM

## 2022-01-19 DIAGNOSIS — R2681 Unsteadiness on feet: Secondary | ICD-10-CM | POA: Diagnosis not present

## 2022-01-21 DIAGNOSIS — R2681 Unsteadiness on feet: Secondary | ICD-10-CM | POA: Diagnosis not present

## 2022-01-21 DIAGNOSIS — R42 Dizziness and giddiness: Secondary | ICD-10-CM | POA: Diagnosis not present

## 2022-02-03 DIAGNOSIS — R2689 Other abnormalities of gait and mobility: Secondary | ICD-10-CM | POA: Diagnosis not present

## 2022-02-03 DIAGNOSIS — Z299 Encounter for prophylactic measures, unspecified: Secondary | ICD-10-CM | POA: Diagnosis not present

## 2022-02-03 DIAGNOSIS — Z713 Dietary counseling and surveillance: Secondary | ICD-10-CM | POA: Diagnosis not present

## 2022-02-03 DIAGNOSIS — Z6829 Body mass index (BMI) 29.0-29.9, adult: Secondary | ICD-10-CM | POA: Diagnosis not present

## 2022-02-03 DIAGNOSIS — R42 Dizziness and giddiness: Secondary | ICD-10-CM | POA: Diagnosis not present

## 2022-02-04 ENCOUNTER — Ambulatory Visit
Admission: RE | Admit: 2022-02-04 | Discharge: 2022-02-04 | Disposition: A | Discharge status: Home / Self Care | Payer: PPO | Source: Ambulatory Visit | Attending: Internal Medicine | Admitting: Internal Medicine

## 2022-02-04 DIAGNOSIS — Z1231 Encounter for screening mammogram for malignant neoplasm of breast: Secondary | ICD-10-CM | POA: Diagnosis not present

## 2022-02-09 DIAGNOSIS — R250 Abnormal head movements: Secondary | ICD-10-CM | POA: Diagnosis not present

## 2022-03-09 DIAGNOSIS — Z299 Encounter for prophylactic measures, unspecified: Secondary | ICD-10-CM | POA: Diagnosis not present

## 2022-03-09 DIAGNOSIS — I700 Atherosclerosis of aorta: Secondary | ICD-10-CM | POA: Diagnosis not present

## 2022-03-09 DIAGNOSIS — F339 Major depressive disorder, recurrent, unspecified: Secondary | ICD-10-CM | POA: Diagnosis not present

## 2022-03-09 DIAGNOSIS — E1165 Type 2 diabetes mellitus with hyperglycemia: Secondary | ICD-10-CM | POA: Diagnosis not present

## 2022-03-09 DIAGNOSIS — E1140 Type 2 diabetes mellitus with diabetic neuropathy, unspecified: Secondary | ICD-10-CM | POA: Diagnosis not present

## 2022-05-11 DIAGNOSIS — Z299 Encounter for prophylactic measures, unspecified: Secondary | ICD-10-CM | POA: Diagnosis not present

## 2022-05-11 DIAGNOSIS — M7072 Other bursitis of hip, left hip: Secondary | ICD-10-CM | POA: Diagnosis not present

## 2022-05-11 DIAGNOSIS — R109 Unspecified abdominal pain: Secondary | ICD-10-CM | POA: Diagnosis not present

## 2022-05-11 DIAGNOSIS — K219 Gastro-esophageal reflux disease without esophagitis: Secondary | ICD-10-CM | POA: Diagnosis not present

## 2022-06-05 DIAGNOSIS — K046 Periapical abscess with sinus: Secondary | ICD-10-CM | POA: Diagnosis not present

## 2022-06-05 DIAGNOSIS — Z1339 Encounter for screening examination for other mental health and behavioral disorders: Secondary | ICD-10-CM | POA: Diagnosis not present

## 2022-06-05 DIAGNOSIS — Z0000 Encounter for general adult medical examination without abnormal findings: Secondary | ICD-10-CM | POA: Diagnosis not present

## 2022-06-05 DIAGNOSIS — Z299 Encounter for prophylactic measures, unspecified: Secondary | ICD-10-CM | POA: Diagnosis not present

## 2022-06-05 DIAGNOSIS — F339 Major depressive disorder, recurrent, unspecified: Secondary | ICD-10-CM | POA: Diagnosis not present

## 2022-06-05 DIAGNOSIS — Z7189 Other specified counseling: Secondary | ICD-10-CM | POA: Diagnosis not present

## 2022-06-05 DIAGNOSIS — I700 Atherosclerosis of aorta: Secondary | ICD-10-CM | POA: Diagnosis not present

## 2022-06-05 DIAGNOSIS — Z1331 Encounter for screening for depression: Secondary | ICD-10-CM | POA: Diagnosis not present

## 2022-06-11 DIAGNOSIS — Z299 Encounter for prophylactic measures, unspecified: Secondary | ICD-10-CM | POA: Diagnosis not present

## 2022-06-11 DIAGNOSIS — E1165 Type 2 diabetes mellitus with hyperglycemia: Secondary | ICD-10-CM | POA: Diagnosis not present

## 2022-06-11 DIAGNOSIS — M25552 Pain in left hip: Secondary | ICD-10-CM | POA: Diagnosis not present

## 2022-06-11 DIAGNOSIS — K5792 Diverticulitis of intestine, part unspecified, without perforation or abscess without bleeding: Secondary | ICD-10-CM | POA: Diagnosis not present

## 2022-06-16 DIAGNOSIS — M25552 Pain in left hip: Secondary | ICD-10-CM | POA: Diagnosis not present

## 2022-09-10 DIAGNOSIS — E1165 Type 2 diabetes mellitus with hyperglycemia: Secondary | ICD-10-CM | POA: Diagnosis not present

## 2022-09-10 DIAGNOSIS — J349 Unspecified disorder of nose and nasal sinuses: Secondary | ICD-10-CM | POA: Diagnosis not present

## 2022-09-10 DIAGNOSIS — I4892 Unspecified atrial flutter: Secondary | ICD-10-CM | POA: Diagnosis not present

## 2022-09-10 DIAGNOSIS — H6690 Otitis media, unspecified, unspecified ear: Secondary | ICD-10-CM | POA: Diagnosis not present

## 2022-09-10 DIAGNOSIS — Z299 Encounter for prophylactic measures, unspecified: Secondary | ICD-10-CM | POA: Diagnosis not present

## 2022-09-17 DIAGNOSIS — Z299 Encounter for prophylactic measures, unspecified: Secondary | ICD-10-CM | POA: Diagnosis not present

## 2022-09-17 DIAGNOSIS — I4892 Unspecified atrial flutter: Secondary | ICD-10-CM | POA: Diagnosis not present

## 2022-09-17 DIAGNOSIS — E1165 Type 2 diabetes mellitus with hyperglycemia: Secondary | ICD-10-CM | POA: Diagnosis not present

## 2022-09-17 DIAGNOSIS — E1140 Type 2 diabetes mellitus with diabetic neuropathy, unspecified: Secondary | ICD-10-CM | POA: Diagnosis not present

## 2022-11-03 DIAGNOSIS — R42 Dizziness and giddiness: Secondary | ICD-10-CM | POA: Diagnosis not present

## 2022-11-03 DIAGNOSIS — H9203 Otalgia, bilateral: Secondary | ICD-10-CM | POA: Diagnosis not present

## 2022-12-03 DIAGNOSIS — H40033 Anatomical narrow angle, bilateral: Secondary | ICD-10-CM | POA: Diagnosis not present

## 2022-12-03 DIAGNOSIS — E119 Type 2 diabetes mellitus without complications: Secondary | ICD-10-CM | POA: Diagnosis not present

## 2022-12-04 DIAGNOSIS — R5383 Other fatigue: Secondary | ICD-10-CM | POA: Diagnosis not present

## 2022-12-04 DIAGNOSIS — N390 Urinary tract infection, site not specified: Secondary | ICD-10-CM | POA: Diagnosis not present

## 2022-12-04 DIAGNOSIS — Z299 Encounter for prophylactic measures, unspecified: Secondary | ICD-10-CM | POA: Diagnosis not present

## 2022-12-04 DIAGNOSIS — Z79899 Other long term (current) drug therapy: Secondary | ICD-10-CM | POA: Diagnosis not present

## 2022-12-04 DIAGNOSIS — R350 Frequency of micturition: Secondary | ICD-10-CM | POA: Diagnosis not present

## 2022-12-04 DIAGNOSIS — Z0000 Encounter for general adult medical examination without abnormal findings: Secondary | ICD-10-CM | POA: Diagnosis not present

## 2022-12-04 DIAGNOSIS — E7800 Pure hypercholesterolemia, unspecified: Secondary | ICD-10-CM | POA: Diagnosis not present

## 2022-12-09 DIAGNOSIS — E1165 Type 2 diabetes mellitus with hyperglycemia: Secondary | ICD-10-CM | POA: Diagnosis not present

## 2022-12-09 DIAGNOSIS — R110 Nausea: Secondary | ICD-10-CM | POA: Diagnosis not present

## 2022-12-09 DIAGNOSIS — Z2821 Immunization not carried out because of patient refusal: Secondary | ICD-10-CM | POA: Diagnosis not present

## 2022-12-09 DIAGNOSIS — J069 Acute upper respiratory infection, unspecified: Secondary | ICD-10-CM | POA: Diagnosis not present

## 2022-12-09 DIAGNOSIS — Z299 Encounter for prophylactic measures, unspecified: Secondary | ICD-10-CM | POA: Diagnosis not present

## 2022-12-24 DIAGNOSIS — E1165 Type 2 diabetes mellitus with hyperglycemia: Secondary | ICD-10-CM | POA: Diagnosis not present

## 2022-12-24 DIAGNOSIS — I4892 Unspecified atrial flutter: Secondary | ICD-10-CM | POA: Diagnosis not present

## 2022-12-24 DIAGNOSIS — Z299 Encounter for prophylactic measures, unspecified: Secondary | ICD-10-CM | POA: Diagnosis not present

## 2022-12-24 DIAGNOSIS — J45909 Unspecified asthma, uncomplicated: Secondary | ICD-10-CM | POA: Diagnosis not present

## 2022-12-30 ENCOUNTER — Ambulatory Visit: Payer: PPO | Attending: Cardiology | Admitting: Cardiology

## 2022-12-30 ENCOUNTER — Encounter: Payer: Self-pay | Admitting: Cardiology

## 2022-12-30 VITALS — BP 112/68 | HR 61 | Ht 63.0 in | Wt 173.4 lb

## 2022-12-30 DIAGNOSIS — E782 Mixed hyperlipidemia: Secondary | ICD-10-CM | POA: Diagnosis not present

## 2022-12-30 DIAGNOSIS — I471 Supraventricular tachycardia, unspecified: Secondary | ICD-10-CM

## 2022-12-30 NOTE — Progress Notes (Signed)
    Cardiology Office Note  Date: 12/30/2022   ID: Marilyn Alvarez, DOB 1950-03-12, MRN [id number]  History of Present Illness: Marilyn Alvarez is a 72 y.o. female last seen in August 2023.  She is here for a routine visit.  Reports good control of palpitations on atenolol, no new exertional symptoms.  No dizziness or syncope.  I reviewed her medications.  She continues on atenolol 25 mg daily and Crestor 10 mg daily.  Recent LDL 61.  She continues to follow with Dr. Sherril Croon.  I reviewed her ECG today which shows sinus rhythm with decreased R wave progression and low voltage in the precordial leads.  Physical Exam: VS:  BP 112/68 (BP Location: Left Arm)   Pulse 61   Ht 5\' 3"  (1.6 m)   Wt 173 lb 6.4 oz (78.7 kg)   SpO2 99%   BMI 30.72 kg/m , BMI Body mass index is 30.72 kg/m.  Wt Readings from Last 3 Encounters:  12/30/22 173 lb 6.4 oz (78.7 kg)  10/16/21 170 lb 12.8 oz (77.5 kg)  04/10/21 172 lb (78 kg)    General: Patient appears comfortable at rest. HEENT: Conjunctiva and lids normal. Neck: Supple, no elevated JVP or carotid bruits. Lungs: Clear to auscultation, nonlabored breathing at rest. Cardiac: Regular rate and rhythm, no S3 or significant systolic murmur, no pericardial rub.  ECG:  An ECG dated 04/10/2021 was personally reviewed today and demonstrated:  Sinus rhythm with low voltage, decreased R wave progression.  Labwork:  October 2024: Hemoglobin 13.7, platelets 285, BUN 15, creatinine 0.93, potassium 4.2, AST 25, ALT 17, cholesterol 145, triglycerides 175, HDL 55, LDL 61, TSH 2.05  Other Studies Reviewed Today:  No interval cardiac testing for review today.  Assessment and Plan:  1.  Intermittent palpitations with history of SVT and ectopy by cardiac monitor.  Symptomatically stable on atenolol.  ECG reviewed.  Continue with current plan.   2.  Mixed hyperlipidemia, remains on Crestor.  Recent LDL 61.  Disposition:  Follow up  1 year, sooner if  needed.  Signed, Jonelle Sidle, M.D., F.A.C.C. [facility]

## 2022-12-30 NOTE — Patient Instructions (Addendum)

## 2023-01-12 DIAGNOSIS — E1169 Type 2 diabetes mellitus with other specified complication: Secondary | ICD-10-CM | POA: Diagnosis not present

## 2023-01-12 DIAGNOSIS — Z299 Encounter for prophylactic measures, unspecified: Secondary | ICD-10-CM | POA: Diagnosis not present

## 2023-01-12 DIAGNOSIS — R059 Cough, unspecified: Secondary | ICD-10-CM | POA: Diagnosis not present

## 2023-01-12 DIAGNOSIS — J020 Streptococcal pharyngitis: Secondary | ICD-10-CM | POA: Diagnosis not present

## 2023-01-12 DIAGNOSIS — R52 Pain, unspecified: Secondary | ICD-10-CM | POA: Diagnosis not present

## 2023-01-29 ENCOUNTER — Other Ambulatory Visit: Payer: Self-pay | Admitting: Internal Medicine

## 2023-01-29 DIAGNOSIS — Z1231 Encounter for screening mammogram for malignant neoplasm of breast: Secondary | ICD-10-CM

## 2023-02-08 ENCOUNTER — Ambulatory Visit
Admission: RE | Admit: 2023-02-08 | Discharge: 2023-02-08 | Disposition: A | Discharge status: Home / Self Care | Payer: PPO | Source: Ambulatory Visit | Attending: Internal Medicine | Admitting: Internal Medicine

## 2023-02-08 DIAGNOSIS — R350 Frequency of micturition: Secondary | ICD-10-CM | POA: Diagnosis not present

## 2023-02-08 DIAGNOSIS — Z299 Encounter for prophylactic measures, unspecified: Secondary | ICD-10-CM | POA: Diagnosis not present

## 2023-02-08 DIAGNOSIS — Z1231 Encounter for screening mammogram for malignant neoplasm of breast: Secondary | ICD-10-CM | POA: Diagnosis not present

## 2023-02-08 DIAGNOSIS — B379 Candidiasis, unspecified: Secondary | ICD-10-CM | POA: Diagnosis not present

## 2023-02-08 DIAGNOSIS — E1165 Type 2 diabetes mellitus with hyperglycemia: Secondary | ICD-10-CM | POA: Diagnosis not present

## 2023-03-29 DIAGNOSIS — E1169 Type 2 diabetes mellitus with other specified complication: Secondary | ICD-10-CM | POA: Diagnosis not present

## 2023-03-29 DIAGNOSIS — I10 Essential (primary) hypertension: Secondary | ICD-10-CM | POA: Diagnosis not present

## 2023-03-29 DIAGNOSIS — Z299 Encounter for prophylactic measures, unspecified: Secondary | ICD-10-CM | POA: Diagnosis not present

## 2023-03-29 DIAGNOSIS — I4892 Unspecified atrial flutter: Secondary | ICD-10-CM | POA: Diagnosis not present

## 2023-05-02 IMAGING — MG MM DIGITAL SCREENING BILAT W/ TOMO AND CAD
6 of 10 series · 6 of 30 positions shown · non-contrast
Comparison: Previous exam(s).

CLINICAL DATA: Screening.

EXAM:
DIGITAL SCREENING BILATERAL MAMMOGRAM WITH TOMOSYNTHESIS AND CAD
TECHNIQUE: Bilateral screening digital craniocaudal and mediolateral oblique
mammograms were obtained. Bilateral screening digital breast
tomosynthesis was performed. The images were evaluated with
computer-aided detection.

[L MLO synth-2D]
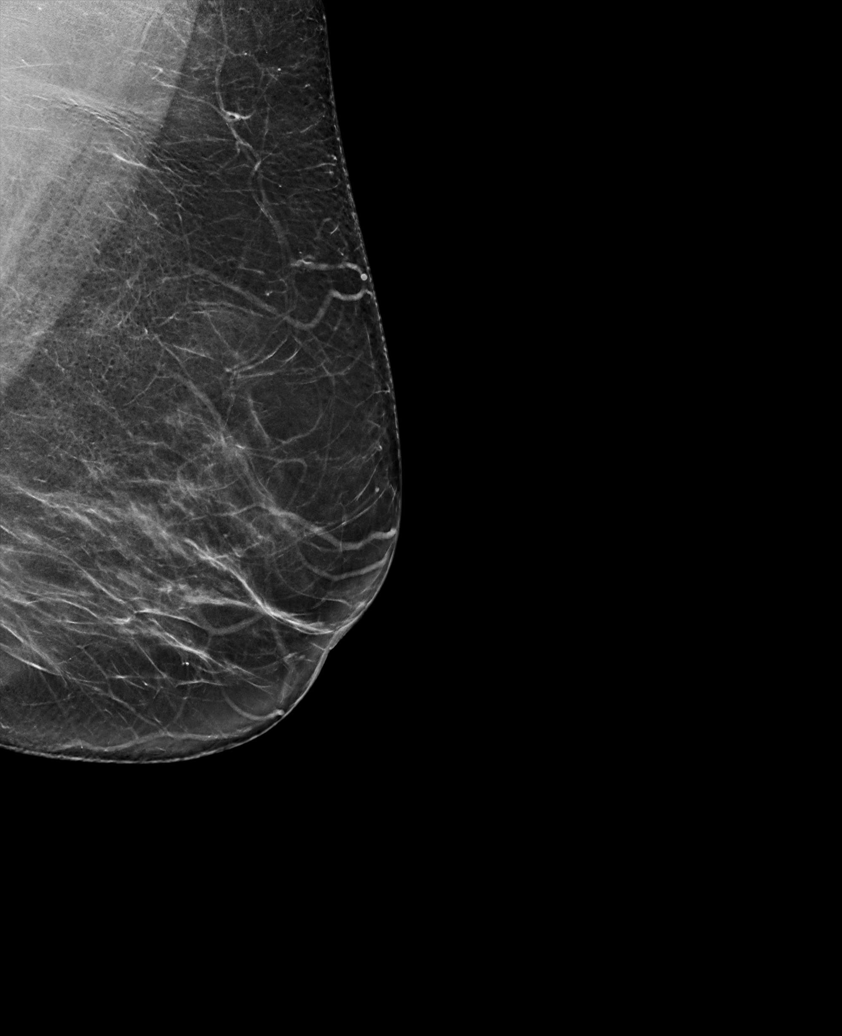

[R CC synth-2D]
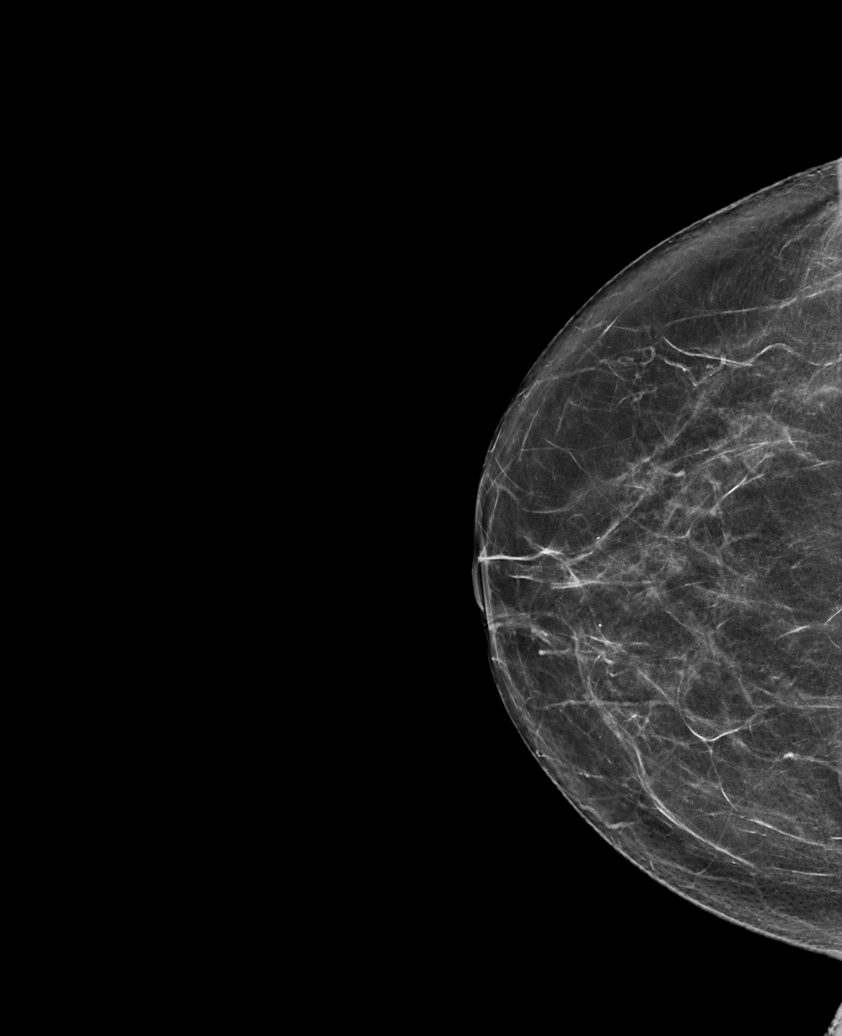

[R XCCL synth-2D]
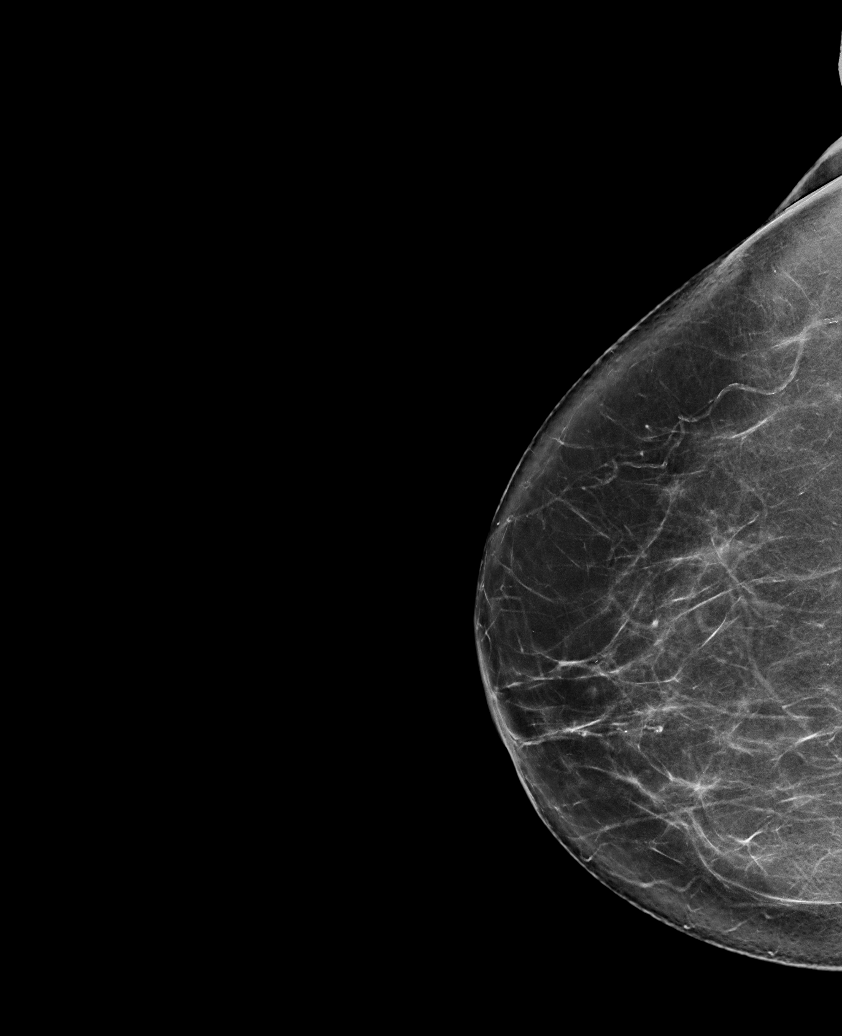

[R MLO synth-2D]
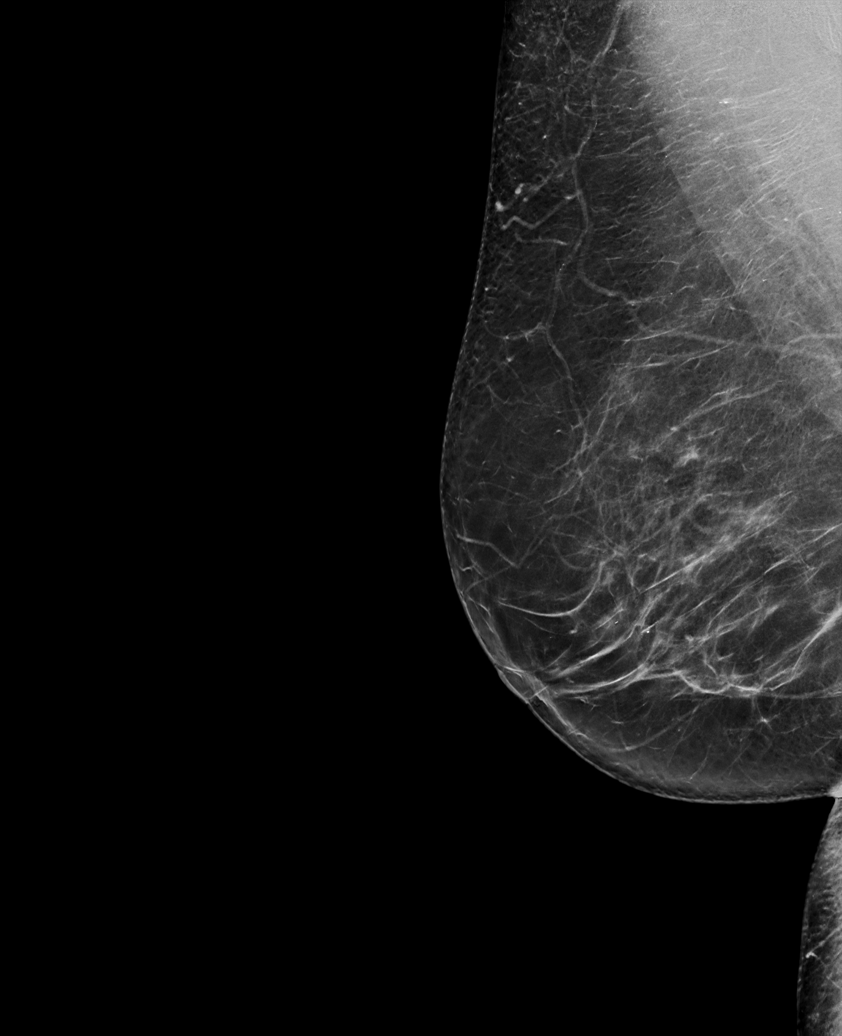

[L CC synth-2D]
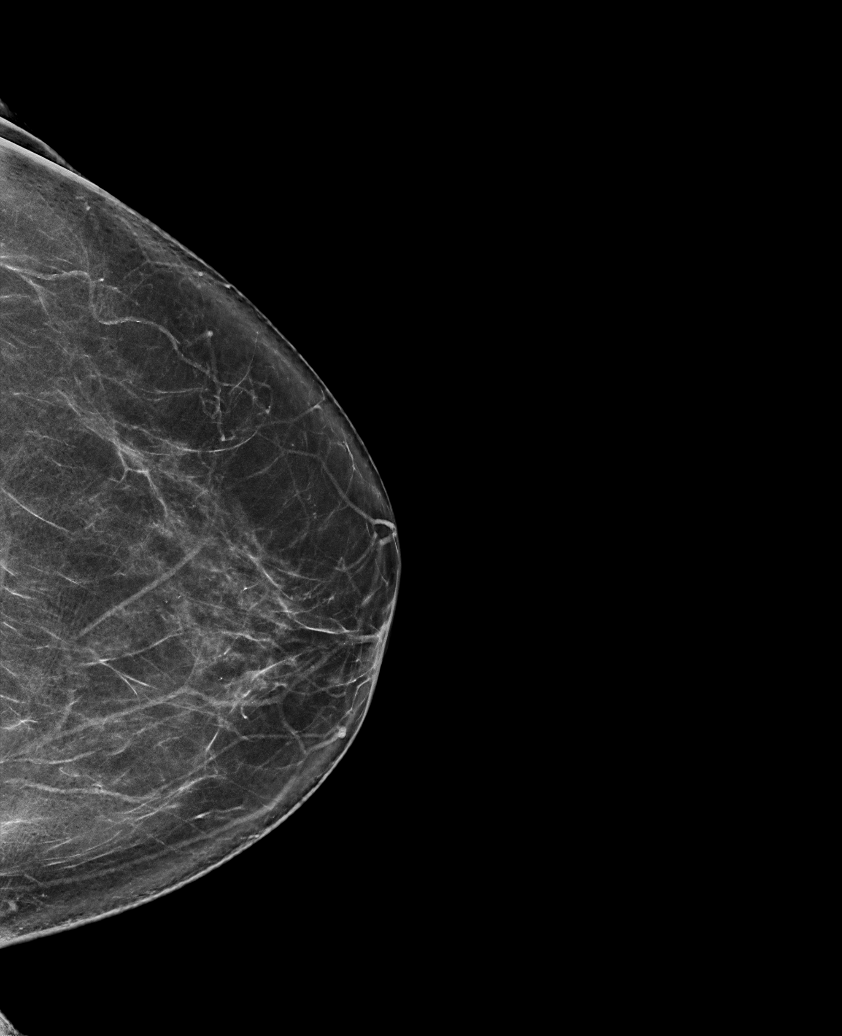

[L MLO tomo · tomo slice 39/76.0]
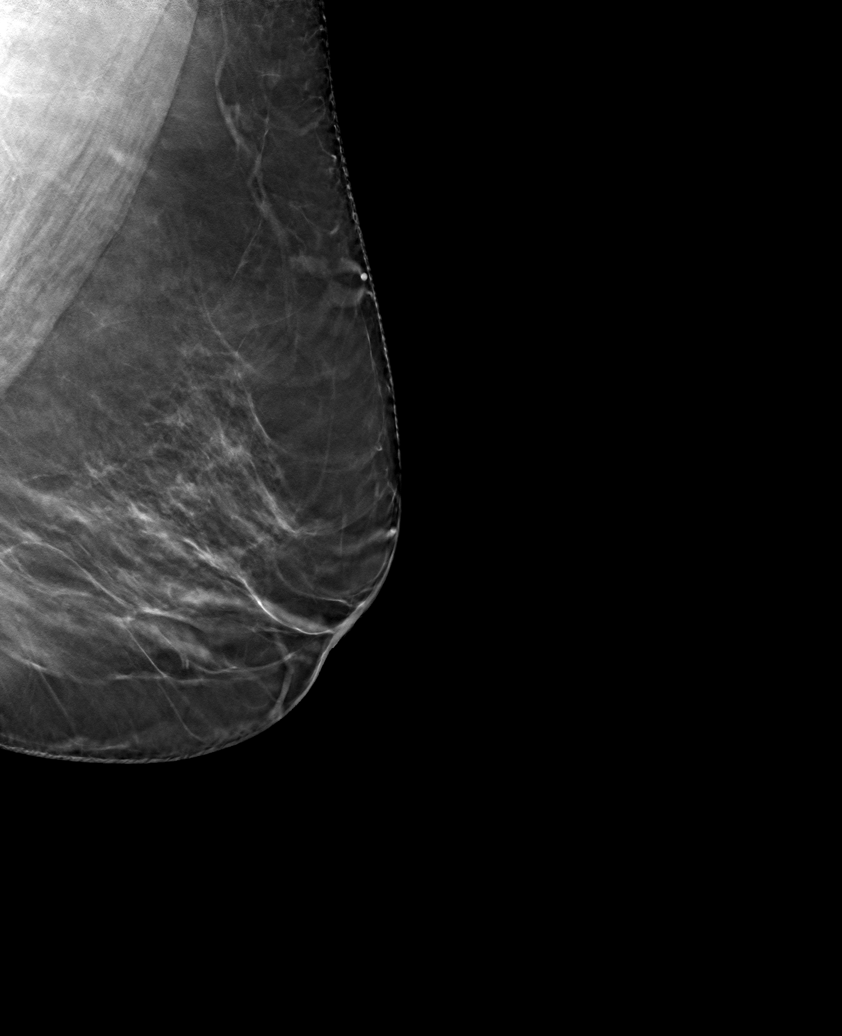

[6 of 30 positions shown; findings below may reference images not displayed]

ACR Breast Density Category b: There are scattered areas of
fibroglandular density.
FINDINGS: There are no findings suspicious for malignancy.
IMPRESSION: No mammographic evidence of malignancy. A result letter of this
screening mammogram will be mailed directly to the patient.

RECOMMENDATION:
Screening mammogram in one year. (Code:51-O-LD2)

BI-RADS CATEGORY  1: Negative.

## 2023-06-08 DIAGNOSIS — Z1339 Encounter for screening examination for other mental health and behavioral disorders: Secondary | ICD-10-CM | POA: Diagnosis not present

## 2023-06-08 DIAGNOSIS — Z6831 Body mass index (BMI) 31.0-31.9, adult: Secondary | ICD-10-CM | POA: Diagnosis not present

## 2023-06-08 DIAGNOSIS — R32 Unspecified urinary incontinence: Secondary | ICD-10-CM | POA: Diagnosis not present

## 2023-06-08 DIAGNOSIS — Z0000 Encounter for general adult medical examination without abnormal findings: Secondary | ICD-10-CM | POA: Diagnosis not present

## 2023-06-08 DIAGNOSIS — I700 Atherosclerosis of aorta: Secondary | ICD-10-CM | POA: Diagnosis not present

## 2023-06-08 DIAGNOSIS — I10 Essential (primary) hypertension: Secondary | ICD-10-CM | POA: Diagnosis not present

## 2023-06-08 DIAGNOSIS — Z299 Encounter for prophylactic measures, unspecified: Secondary | ICD-10-CM | POA: Diagnosis not present

## 2023-06-08 DIAGNOSIS — Z1331 Encounter for screening for depression: Secondary | ICD-10-CM | POA: Diagnosis not present

## 2023-06-08 DIAGNOSIS — Z7189 Other specified counseling: Secondary | ICD-10-CM | POA: Diagnosis not present

## 2023-07-01 DIAGNOSIS — I10 Essential (primary) hypertension: Secondary | ICD-10-CM | POA: Diagnosis not present

## 2023-07-01 DIAGNOSIS — I700 Atherosclerosis of aorta: Secondary | ICD-10-CM | POA: Diagnosis not present

## 2023-07-01 DIAGNOSIS — Z299 Encounter for prophylactic measures, unspecified: Secondary | ICD-10-CM | POA: Diagnosis not present

## 2023-07-01 DIAGNOSIS — I4892 Unspecified atrial flutter: Secondary | ICD-10-CM | POA: Diagnosis not present

## 2023-07-01 DIAGNOSIS — E1165 Type 2 diabetes mellitus with hyperglycemia: Secondary | ICD-10-CM | POA: Diagnosis not present

## 2023-10-08 DIAGNOSIS — Z299 Encounter for prophylactic measures, unspecified: Secondary | ICD-10-CM | POA: Diagnosis not present

## 2023-10-08 DIAGNOSIS — I700 Atherosclerosis of aorta: Secondary | ICD-10-CM | POA: Diagnosis not present

## 2023-10-08 DIAGNOSIS — E119 Type 2 diabetes mellitus without complications: Secondary | ICD-10-CM | POA: Diagnosis not present

## 2023-10-08 DIAGNOSIS — I10 Essential (primary) hypertension: Secondary | ICD-10-CM | POA: Diagnosis not present

## 2023-12-10 DIAGNOSIS — R5383 Other fatigue: Secondary | ICD-10-CM | POA: Diagnosis not present

## 2023-12-10 DIAGNOSIS — Z299 Encounter for prophylactic measures, unspecified: Secondary | ICD-10-CM | POA: Diagnosis not present

## 2023-12-10 DIAGNOSIS — I10 Essential (primary) hypertension: Secondary | ICD-10-CM | POA: Diagnosis not present

## 2023-12-10 DIAGNOSIS — Z0000 Encounter for general adult medical examination without abnormal findings: Secondary | ICD-10-CM | POA: Diagnosis not present

## 2023-12-10 DIAGNOSIS — K5792 Diverticulitis of intestine, part unspecified, without perforation or abscess without bleeding: Secondary | ICD-10-CM | POA: Diagnosis not present

## 2023-12-10 DIAGNOSIS — E7800 Pure hypercholesterolemia, unspecified: Secondary | ICD-10-CM | POA: Diagnosis not present

## 2024-01-04 DIAGNOSIS — E2839 Other primary ovarian failure: Secondary | ICD-10-CM | POA: Diagnosis not present

## 2024-01-13 DIAGNOSIS — E1140 Type 2 diabetes mellitus with diabetic neuropathy, unspecified: Secondary | ICD-10-CM | POA: Diagnosis not present

## 2024-01-13 DIAGNOSIS — R52 Pain, unspecified: Secondary | ICD-10-CM | POA: Diagnosis not present

## 2024-01-13 DIAGNOSIS — E7800 Pure hypercholesterolemia, unspecified: Secondary | ICD-10-CM | POA: Diagnosis not present

## 2024-01-13 DIAGNOSIS — I4892 Unspecified atrial flutter: Secondary | ICD-10-CM | POA: Diagnosis not present

## 2024-01-13 DIAGNOSIS — I10 Essential (primary) hypertension: Secondary | ICD-10-CM | POA: Diagnosis not present

## 2024-01-13 DIAGNOSIS — E119 Type 2 diabetes mellitus without complications: Secondary | ICD-10-CM | POA: Diagnosis not present

## 2024-01-13 DIAGNOSIS — Z299 Encounter for prophylactic measures, unspecified: Secondary | ICD-10-CM | POA: Diagnosis not present

## 2024-02-09 ENCOUNTER — Encounter

## 2024-02-18 ENCOUNTER — Other Ambulatory Visit: Payer: Self-pay | Admitting: Internal Medicine

## 2024-02-18 DIAGNOSIS — Z1231 Encounter for screening mammogram for malignant neoplasm of breast: Secondary | ICD-10-CM

## 2024-02-22 ENCOUNTER — Ambulatory Visit
Admission: RE | Admit: 2024-02-22 | Discharge: 2024-02-22 | Disposition: A | Discharge status: Home / Self Care | Source: Ambulatory Visit | Attending: Internal Medicine | Admitting: Internal Medicine

## 2024-02-22 DIAGNOSIS — Z1231 Encounter for screening mammogram for malignant neoplasm of breast: Secondary | ICD-10-CM
